# Patient Record
Sex: Male | Born: 1975 | Race: Black or African American | Hispanic: No | Marital: Single | Smoking: Never smoker
Health system: Southern US, Community
[De-identification: ages and names within clinical notes are randomized; demographics above are authoritative.]

## PROBLEM LIST (undated history)

## (undated) DIAGNOSIS — K219 Gastro-esophageal reflux disease without esophagitis: Secondary | ICD-10-CM

## (undated) DIAGNOSIS — J45909 Unspecified asthma, uncomplicated: Secondary | ICD-10-CM

## (undated) DIAGNOSIS — R4189 Other symptoms and signs involving cognitive functions and awareness: Secondary | ICD-10-CM

## (undated) DIAGNOSIS — F410 Panic disorder [episodic paroxysmal anxiety] without agoraphobia: Secondary | ICD-10-CM

## (undated) HISTORY — DX: Gastro-esophageal reflux disease without esophagitis: K21.9

## (undated) HISTORY — DX: Other symptoms and signs involving cognitive functions and awareness: R41.89

---

## 2001-03-18 ENCOUNTER — Emergency Department (HOSPITAL_COMMUNITY): Admission: EM | Admit: 2001-03-18 | Discharge: 2001-03-18 | Payer: Self-pay | Admitting: Emergency Medicine

## 2004-04-13 ENCOUNTER — Ambulatory Visit: Payer: Self-pay | Admitting: Family Medicine

## 2006-05-15 ENCOUNTER — Ambulatory Visit: Payer: Self-pay | Admitting: Family Medicine

## 2006-07-06 DIAGNOSIS — F79 Unspecified intellectual disabilities: Secondary | ICD-10-CM

## 2006-07-06 DIAGNOSIS — J45909 Unspecified asthma, uncomplicated: Secondary | ICD-10-CM

## 2007-03-15 ENCOUNTER — Ambulatory Visit: Payer: Self-pay | Admitting: Family Medicine

## 2007-05-30 ENCOUNTER — Telehealth: Payer: Self-pay | Admitting: Family Medicine

## 2007-06-18 ENCOUNTER — Ambulatory Visit: Payer: Self-pay | Admitting: Sports Medicine

## 2007-06-18 DIAGNOSIS — J309 Allergic rhinitis, unspecified: Secondary | ICD-10-CM

## 2007-12-12 ENCOUNTER — Ambulatory Visit: Payer: Self-pay | Admitting: Family Medicine

## 2007-12-12 ENCOUNTER — Encounter (INDEPENDENT_AMBULATORY_CARE_PROVIDER_SITE_OTHER): Payer: Self-pay | Admitting: Family Medicine

## 2007-12-12 LAB — CONVERTED CEMR LAB
Cholesterol: 157 mg/dL (ref 0–200)
Glucose, Bld: 98 mg/dL (ref 70–99)
HDL: 64 mg/dL (ref >39)

## 2007-12-13 ENCOUNTER — Encounter (INDEPENDENT_AMBULATORY_CARE_PROVIDER_SITE_OTHER): Payer: Self-pay | Admitting: Family Medicine

## 2008-08-12 ENCOUNTER — Ambulatory Visit: Payer: Self-pay | Admitting: Family Medicine

## 2008-08-12 DIAGNOSIS — L259 Unspecified contact dermatitis, unspecified cause: Secondary | ICD-10-CM

## 2009-05-18 ENCOUNTER — Ambulatory Visit: Payer: Self-pay | Admitting: Family Medicine

## 2010-04-07 ENCOUNTER — Ambulatory Visit: Payer: Self-pay | Admitting: Family Medicine

## 2010-06-08 NOTE — Assessment & Plan Note (Signed)
Summary: refill meds,df   Vital Signs:  Patient profile:   35 year old male Height:      63 inches Weight:      120.7 pounds BMI:     21.46 Temp:     97.9 degrees F oral Pulse rate:   62 / minute BP sitting:   129 / 83  (right arm) Cuff size:   regular  Vitals Entered By: Jimmy Footman, CMA (April 07, 2010 11:03 AM) CC: med refill Is Patient Diabetic? No Pain Assessment Patient in pain? no        CC:  med refill.  Asthma History    Initial Asthma Severity Rating:    Age range: 12+ years    Symptoms: 0-2 days/week    Nighttime Awakenings: 0-2/month    Interferes w/ normal activity: no limitations    SABA use (not for EIB): 0-2 days/week    Asthma Severity Assessment: Mild Persistent    Reason for alternate asthma severity rating: Symptoms improved with advair dosing. Now down to use of albuterol 1-2 times per week for increased WOB.    Habits & Providers  Alcohol-Tobacco-Diet     Tobacco Status: never  Physical Exam  General:  alert and well-developed.   Head:  normocephalic and atraumatic.   Eyes:  vision grossly intact, pupils equal, pupils round, and pupils reactive to light.   Ears:  R ear normal and L ear normal.   Nose:  no external deformity.   Mouth:  good dentition.   Neck:  supple and full ROM.   Lungs:  CTAB, no wheezes noted  Heart:  RRR, no rubs, gallops, murmurs  Abdomen:  soft, non-tender, and normal bowel sounds.     Impression & Recommendations:  Problem # 1:  ASTHMA (ICD-493.90) Overall asthma symptomatology much improved with advair per pt. Will continue with advair and as needed albuterol. May consider addition of singulair if sxs flare in spring and summer months. Pt agreeable to plan.  The following medications were removed from the medication list:    Flovent Hfa 110 Mcg/act Aero (Fluticasone propionate  hfa) .Marland Kitchen... 2 puffs two times a day His updated medication list for this problem includes:    Albuterol 90 Mcg/act Aers (Albuterol)  .Marland Kitchen... 2 inh every 4 hours as needed    Advair Diskus 100-50 Mcg/dose Aepb (Fluticasone-salmeterol) .Marland Kitchen... Take 1 puff twice daily  Complete Medication List: 1)  Albuterol 90 Mcg/act Aers (Albuterol) .... 2 inh every 4 hours as needed 2)  Claritin 10 Mg Tabs (Loratadine) .Marland Kitchen.. 1 by mouth daily 3)  Advair Diskus 100-50 Mcg/dose Aepb (Fluticasone-salmeterol) .... Take 1 puff twice daily  Patient Instructions: 1)  It was good to see you today.  2)  Your breathing is doing much better 3)  I will refill your albuterol and advair 4)  We may consider adding something like singulair if your symptoms flare up in the spring/summer 5)  Otherwise, come back to see me in 3-6 months 6)  Call with any questions 7)  God Bless,  8)  Doree Albee MD  Prescriptions: ADVAIR DISKUS 100-50 MCG/DOSE AEPB (FLUTICASONE-SALMETEROL) take 1 puff twice daily  #1 diskus x 11   Entered and Authorized by:   Doree Albee MD   Signed by:   Doree Albee MD on 04/07/2010   Method used:   Electronically to        [facility]  [address] [id number] (retail)       [address]  [place], [place]  [address]       Ph: [phone] or [phone]       Fax: [phone]   RxID:   [id number] ALBUTEROL 90 MCG/ACT AERS (ALBUTEROL) 2 inh every 4 hours as needed  #9 Gram x 3   Entered and Authorized by:   Doree Albee MD   Signed by:   Doree Albee MD on 04/07/2010   Method used:   [facility]ically to        [facility]  [address] [id number] ([facility]       [address]       [place], [place]  [address]       Ph: [phone] or [phone]       Fax: [phone]   RxID:   [id number]       Prevention & Chronic Care Immunizations   Influenza vaccine: Fluvax MCR  (05/18/2009)   Influenza vaccine deferral: Deferred  (04/07/2010)   Influenza vaccine due: 03/14/2008    Tetanus booster: 05/18/2009: Tdap    Pneumococcal vaccine: Not documented  Other Screening   Smoking status:  never  (04/07/2010)  Lipids   Total Cholesterol: 157  (12/12/2007)   LDL: 86  (12/12/2007)   LDL Direct: Not documented   HDL: 64  (12/12/2007)   Triglycerides: 35  (12/12/2007)  Appended Document: refill meds,df    Clinical Lists Changes  Orders: Added new Test order of [facility]- Est Level  3 (28315) - Signed

## 2010-06-08 NOTE — Assessment & Plan Note (Signed)
Summary: f/up asthma,tcb   Vital Signs:  Patient profile:   35 year old male Weight:      116.1 pounds Temp:     97.8 degrees F oral Pulse rate:   54 / minute Pulse rhythm:   regular BP sitting:   124 / 78  (right arm) Cuff size:   regular  Vitals Entered By: Loralee Pacas CMA (May 18, 2009 3:48 PM)   Complete Medication List: 1)  Albuterol 90 Mcg/act Aers (Albuterol) .... 2 inh every 4 hours as needed 2)  Flovent Hfa 110 Mcg/act Aero (Fluticasone propionate  hfa) .... 2 puffs two times a day 3)  Claritin 10 Mg Tabs (Loratadine) .Marland Kitchen.. 1 by mouth daily 4)  Advair Diskus 100-50 Mcg/dose Aepb (Fluticasone-salmeterol) .... Take 1 puff twice daily  Other Orders: Influenza Vaccine MCR (14782) Tdap => 50yrs IM (95621) Admin 1st Vaccine (30865) Prescriptions: ALBUTEROL 90 MCG/ACT AERS (ALBUTEROL) 2 inh every 4 hours as needed  #9 Gram x 3   Entered and Authorized by:   Doree Albee MD   Signed by:   Doree Albee MD on 05/18/2009   Method used:   [facility]        [facility]  [address] [id number]       [address]       [place], [place]  [address]       Ph: [phone] or [phone]       Fax: [phone]   RxID:   [id number] ADVAIR DISKUS 100-50 MCG/DOSE AEPB (FLUTICASONE-SALMETEROL) take 1 puff twice daily  #1 diskus x 3   Entered and Authorized by:   Doree Albee MD   Signed by:   Doree Albee MD on 05/18/2009   Method used:   [facility]        [facility]  [address] [id number]       [address]       [place], [place]  [address]       Ph: [phone] or [phone]       Fax: [phone]   RxID:   [id number]    Immunizations Administered:  Influenza Vaccine # 1:    Vaccine Type: Fluvax MCR    Site: right deltoid    Mfr: GlaxoSmithKline    Dose: 0.5 ml    Route: IM    Given by: Loralee Pacas CMA    Exp. Date: 11/05/2009    Lot #: [id number]    VIS given: 12/07/2008    Physician  counseled: yes  Tetanus Vaccine:    Vaccine Type: Tdap    Site: left deltoid    Mfr: GlaxoSmithKline    Dose: 0.5 ml    Route: IM    Given by: Gladstone Pih    Exp. Date: 07/04/2011    Lot #: [id number]    VIS given: 03/27/07 version given May 18, 2009.    Physician counseled: yes    Prevention & Chronic Care Immunizations   Influenza vaccine: Fluvax MCR  (05/18/2009)   Influenza vaccine due: 03/14/2008    Tetanus booster: 05/18/2009: Tdap    Pneumococcal vaccine: Not documented  Other Screening   Smoking status: never  (06/18/2007)  Lipids   Total Cholesterol: 157  (12/12/2007)   LDL: 86  (12/12/2007)   LDL Direct: Not documented   HDL: 64  (12/12/2007)   Triglycerides: 35  (12/12/2007)   Nursing Instructions: Give tetanus booster today  Appended Document: f/up asthma,tcb-please review addendum **Initial office document prematurely signed...  S- Pt seen for asthma followup. No complaints of SOB, dyspnea, wheezing. Pt states that he uses albuterol 2-3 times/week for episodes of wheezing. Pt states wheezing occurs most often at work where it can be very cold sometimes.  O- AFVSS Gen- awake, NAD in setting of baseline mild MR CV- RRR, no rubs gallops murmurs Pulm- Faint end-expiratory wheezes in bases bilaterally Abd- S/NT/ND A/P- 24 YOM w/ PMhx/o MR and asthma here for asthma followup Asthma- Asthma relatively stable, however w/ persistent albuterol needs. Pulmonary exam stable, however pertinent for obstructive lung sounds. Will start pt on advair as primary maintenance medication, w/ continuation of as needed albuterol. Pt advised to discontinue flovent.

## 2010-06-17 ENCOUNTER — Encounter: Payer: Self-pay | Admitting: *Deleted

## 2011-04-27 ENCOUNTER — Other Ambulatory Visit: Payer: Self-pay | Admitting: Family Medicine

## 2011-04-27 NOTE — Telephone Encounter (Signed)
Refill request

## 2012-01-27 ENCOUNTER — Encounter: Payer: Self-pay | Admitting: Family Medicine

## 2012-01-27 ENCOUNTER — Ambulatory Visit (INDEPENDENT_AMBULATORY_CARE_PROVIDER_SITE_OTHER): Payer: Medicare Other | Admitting: Family Medicine

## 2012-01-27 VITALS — BP 127/84 | HR 87 | Temp 98.2°F | Ht 63.0 in | Wt 123.7 lb

## 2012-01-27 DIAGNOSIS — Z23 Encounter for immunization: Secondary | ICD-10-CM

## 2012-01-27 DIAGNOSIS — J45909 Unspecified asthma, uncomplicated: Secondary | ICD-10-CM

## 2012-01-27 MED ORDER — FLUTICASONE-SALMETEROL 100-50 MCG/DOSE IN AEPB: 1.0000 | INHALATION_SPRAY | Freq: Two times a day (BID) | RESPIRATORY_TRACT | Status: DC

## 2012-01-27 MED ORDER — ALBUTEROL SULFATE HFA 108 (90 BASE) MCG/ACT IN AERS: 2.0000 | INHALATION_SPRAY | Freq: Four times a day (QID) | RESPIRATORY_TRACT | Status: DC | PRN

## 2012-01-27 NOTE — Assessment & Plan Note (Signed)
Patient with mild intermittent asthma, cannot recall last exacerbation.  Here for periodic follow-up and to renew his medications.   Would continue to use the Advair, instructed on Twice-daily use.  Albuterol HFA as needed' to call if he is needing this on routine basis.   Follow up in 6 months or sooner if needed.

## 2012-01-27 NOTE — Progress Notes (Signed)
  Subjective:    Patient ID: Matthew Ellison, male    DOB: January 23, 1976, 36 y.o.   MRN: [id number]  HPI Patient seen today for asthma follow up.  He is out of albuterol rescue HFA and has been out for "over a year", but has not needed it in a long time.  Cannot recall the last time he has used HFA.   He uses Advair 1 puff once daily (prescribed 2 times daily).  Never been a smoker.    Not short of breath, no cough.  Does not use antihistamine, says he doesn't feel like he has allergies.    Review of Systems See above    Objective:   Physical Exam Well appearing, no apparent distress HEENT Neck supple. Boggy nasal mucosa.  TMs clear. Clear oropharynx. No cervical adenopathy COR Regular S1S2, no extra sounds PULM Clear bilaterally, no rales or wheezes        Assessment & Plan:

## 2012-01-27 NOTE — Patient Instructions (Addendum)
It was a pleasure to meet you today.   For the asthma control, please use the ADVAIR (purple disk), 1 puff two times a day.   For shortness of breath from asthma, you may use the ALBUTEROL (Proventil) puffer, 2 puffs every 4 hours as needed.   Follow up in 6 months or sooner as needed.   You got a flu shot today.

## 2013-01-24 ENCOUNTER — Encounter (HOSPITAL_COMMUNITY): Payer: Self-pay | Admitting: Emergency Medicine

## 2013-01-24 ENCOUNTER — Emergency Department (HOSPITAL_COMMUNITY)
Admission: EM | Admit: 2013-01-24 | Discharge: 2013-01-24 | Disposition: A | Discharge status: Home / Self Care | Payer: Medicare Other | Attending: Emergency Medicine | Admitting: Emergency Medicine

## 2013-01-24 DIAGNOSIS — F410 Panic disorder [episodic paroxysmal anxiety] without agoraphobia: Secondary | ICD-10-CM

## 2013-01-24 DIAGNOSIS — R05 Cough: Secondary | ICD-10-CM | POA: Insufficient documentation

## 2013-01-24 DIAGNOSIS — R0602 Shortness of breath: Secondary | ICD-10-CM | POA: Insufficient documentation

## 2013-01-24 DIAGNOSIS — R059 Cough, unspecified: Secondary | ICD-10-CM | POA: Insufficient documentation

## 2013-01-24 DIAGNOSIS — J45901 Unspecified asthma with (acute) exacerbation: Secondary | ICD-10-CM

## 2013-01-24 HISTORY — DX: Unspecified asthma, uncomplicated: J45.909

## 2013-01-24 HISTORY — DX: Panic disorder (episodic paroxysmal anxiety): F41.0

## 2013-01-24 MED ORDER — ALBUTEROL SULFATE HFA 108 (90 BASE) MCG/ACT IN AERS
2.0000 | INHALATION_SPRAY | RESPIRATORY_TRACT | Status: DC | PRN
  Administered 2013-01-24: 2 via RESPIRATORY_TRACT
  Filled 2013-01-24: qty 6.7

## 2013-01-24 NOTE — ED Provider Notes (Signed)
CSN: [id number]     Arrival date & time 01/24/13  1030 History   First MD Initiated Contact with Patient 01/24/13 1112     Chief Complaint  Patient presents with  . Panic Attack   (Consider location/radiation/quality/duration/timing/severity/associated sxs/prior Treatment) HPI Comments: Patient presents with complaint of asthma attack and panic attack. Patient states that he was having difficulty breathing earlier today and this caused him to become scared. He does not currently have an inhaler. No treatments prior to arrival other than albuterol nebulizer given by EMS. This appears to improve symptoms. Currently patient is in no distress. Symptoms resolved. Onset acute. Nothing makes symptoms worse.  The history is provided by the patient.    Past Medical History  Diagnosis Date  . Panic attacks   . Asthma    History reviewed. No pertinent past surgical history. No family history on file. History  Substance Use Topics  . Smoking status: Never Smoker   . Smokeless tobacco: Not on file  . Alcohol Use: No    Review of Systems  Constitutional: Negative for fever.  HENT: Negative for sore throat and rhinorrhea.   Eyes: Negative for redness.  Respiratory: Positive for cough, shortness of breath and wheezing.   Cardiovascular: Negative for chest pain.  Gastrointestinal: Negative for nausea, vomiting, abdominal pain and diarrhea.  Genitourinary: Negative for dysuria.  Musculoskeletal: Negative for myalgias.  Skin: Negative for rash.  Neurological: Negative for headaches.  Psychiatric/Behavioral: Negative for suicidal ideas. The patient is nervous/anxious.     Allergies  Review of patient's allergies indicates no known allergies.  Home Medications   Current Outpatient Rx  Name  Route  Sig  Dispense  Refill  . albuterol (PROVENTIL HFA;VENTOLIN HFA) 108 (90 BASE) MCG/ACT inhaler   Inhalation   Inhale 2 puffs into the lungs every 6 (six) hours as needed for wheezing.   1  Inhaler   4   . Fluticasone-Salmeterol (ADVAIR DISKUS) 100-50 MCG/DOSE AEPB   Inhalation   Inhale 1 puff into the lungs 2 (two) times daily.   60 each   3   . loratadine (CLARITIN) 10 MG tablet   Oral   Take 10 mg by mouth daily.            BP 142/84  Pulse 106  Temp(Src) 98.6 F (37 C) (Oral)  Resp 20  SpO2 100% Physical Exam  Nursing note and vitals reviewed. Constitutional: He appears well-developed and well-nourished.  HENT:  Head: Normocephalic and atraumatic.  Eyes: Conjunctivae are normal. Right eye exhibits no discharge. Left eye exhibits no discharge.  Neck: Normal range of motion. Neck supple.  Cardiovascular: Normal rate, regular rhythm and normal heart sounds.   Pulmonary/Chest: Effort normal and breath sounds normal. No respiratory distress. He has no wheezes. He has no rales.  Abdominal: Soft. There is no tenderness.  Neurological: He is alert.  Skin: Skin is warm and dry.  Psychiatric: He has a normal mood and affect.    ED Course  Procedures (including critical care time) Labs Review Labs Reviewed - No data to display Imaging Review No results found.  12:01 PM Patient seen and examined. Medications ordered.   Vital signs reviewed and are as follows: Filed Vitals:   01/24/13 1033  BP: 142/84  Pulse: 106  Temp: 98.6 F (37 C)  Resp: 20   12:01 PM Patient counseled on use of albuterol HFA.  Told to use 1-2 puffs q 4 hours as needed for SOB.  Patient urged to  return with worsening symptoms or other concerns. Patient verbalized understanding and agrees with plan.    MDM   1. Asthma attack   2. Panic attack    Patient with asthma, resolved. Panic attack likely secondary to asthma attack. Patient is feeling better without additional treatment. Stable for discharge.    Renne Crigler, PA-C 01/24/13 [phone]

## 2013-01-24 NOTE — ED Provider Notes (Signed)
Medical screening examination/treatment/procedure(s) were performed by non-physician practitioner and as supervising physician I was immediately available for consultation/collaboration.   Enid Skeens, MD 01/24/13 2156

## 2013-01-24 NOTE — ED Notes (Signed)
Per EMS, pt has history of panic attacks-[facility] thought it was an asthma attack so they gave him a neb-EMS arrived & pt requested eval at ED

## 2013-02-01 ENCOUNTER — Other Ambulatory Visit: Payer: Self-pay | Admitting: Family Medicine

## 2013-02-06 ENCOUNTER — Encounter (HOSPITAL_COMMUNITY): Payer: Self-pay | Admitting: Vascular Surgery

## 2013-02-06 ENCOUNTER — Inpatient Hospital Stay (HOSPITAL_COMMUNITY)
Admission: EM | Admit: 2013-02-06 | Discharge: 2013-02-08 | DRG: 203 | Disposition: A | Discharge status: Home / Self Care | Payer: Medicare Other | Attending: Family Medicine | Admitting: Family Medicine

## 2013-02-06 ENCOUNTER — Emergency Department (HOSPITAL_COMMUNITY)
Admission: EM | Admit: 2013-02-06 | Discharge: 2013-02-06 | Disposition: A | Discharge status: Home / Self Care | Payer: Medicare Other | Source: Home / Self Care | Attending: Emergency Medicine | Admitting: Emergency Medicine

## 2013-02-06 ENCOUNTER — Telehealth: Payer: Self-pay | Admitting: Family Medicine

## 2013-02-06 ENCOUNTER — Encounter (HOSPITAL_COMMUNITY): Payer: Self-pay | Admitting: Emergency Medicine

## 2013-02-06 ENCOUNTER — Emergency Department (HOSPITAL_COMMUNITY): Payer: Medicare Other

## 2013-02-06 ENCOUNTER — Ambulatory Visit: Payer: Medicare Other | Admitting: Family Medicine

## 2013-02-06 DIAGNOSIS — I498 Other specified cardiac arrhythmias: Secondary | ICD-10-CM | POA: Diagnosis present

## 2013-02-06 DIAGNOSIS — J45901 Unspecified asthma with (acute) exacerbation: Secondary | ICD-10-CM

## 2013-02-06 DIAGNOSIS — F79 Unspecified intellectual disabilities: Secondary | ICD-10-CM

## 2013-02-06 DIAGNOSIS — Z79899 Other long term (current) drug therapy: Secondary | ICD-10-CM

## 2013-02-06 DIAGNOSIS — IMO0002 Reserved for concepts with insufficient information to code with codable children: Secondary | ICD-10-CM

## 2013-02-06 DIAGNOSIS — J45902 Unspecified asthma with status asthmaticus: Principal | ICD-10-CM | POA: Diagnosis present

## 2013-02-06 MED ORDER — ALBUTEROL (5 MG/ML) CONTINUOUS INHALATION SOLN
15.0000 mg/h | INHALATION_SOLUTION | RESPIRATORY_TRACT | Status: DC
  Administered 2013-02-06: 15 mg/h via RESPIRATORY_TRACT

## 2013-02-06 MED ORDER — ALBUTEROL SULFATE HFA 108 (90 BASE) MCG/ACT IN AERS
2.0000 | INHALATION_SPRAY | RESPIRATORY_TRACT | Status: DC | PRN
  Filled 2013-02-06: qty 6.7

## 2013-02-06 MED ORDER — PREDNISONE 20 MG PO TABS: 40.0000 mg | ORAL_TABLET | Freq: Every day | ORAL | Status: DC

## 2013-02-06 MED ORDER — FLUTICASONE-SALMETEROL 100-50 MCG/DOSE IN AEPB: 1.0000 | INHALATION_SPRAY | Freq: Two times a day (BID) | RESPIRATORY_TRACT | Status: DC

## 2013-02-06 MED ORDER — ALBUTEROL (5 MG/ML) CONTINUOUS INHALATION SOLN
INHALATION_SOLUTION | RESPIRATORY_TRACT | Status: AC
  Administered 2013-02-06: 15 mg/h via RESPIRATORY_TRACT
  Filled 2013-02-06: qty 20

## 2013-02-06 NOTE — ED Notes (Signed)
Per EMS: pt c/o SOB since 0200, used up inhalant throughout night. 10 mg albuterol,0.5 atroven, t 125 solumedrol. wheezing in upper right lobe, diminished everywhere else. 20 g in left ac.

## 2013-02-06 NOTE — Telephone Encounter (Signed)
Pt was seen in the ED today and apparently given refill of Advair. I have never met him. I will send in a refill of his meds, but he should also follow up in the office. Thanks! --CMS

## 2013-02-06 NOTE — ED Notes (Signed)
Bed: RESB Expected date:  Expected time:  Means of arrival:  Comments: SOB

## 2013-02-06 NOTE — Telephone Encounter (Signed)
Patient needs refill on Advair

## 2013-02-06 NOTE — ED Provider Notes (Signed)
CSN: [id number]     Arrival date & time 02/06/13  0915 History   First MD Initiated Contact with Patient 02/06/13 [phone]     Chief Complaint  Patient presents with  . Shortness of Breath   (Consider location/radiation/quality/duration/timing/severity/associated sxs/prior Treatment) Patient is a 37 y.o. male presenting with shortness of breath. The history is provided by the patient.  Shortness of Breath Associated symptoms: wheezing   Associated symptoms: no abdominal pain, no chest pain, no fever, no headaches, no rash and no vomiting    Patient shortness of breath. History of asthma. States to get worse around 3 in the morning. He's had albuterol and steroids by EMS. She's had a little bit of cough. No vomiting or diarrhea. He states he ran out of his inhaler last night. Mild dull chest pain. Past Medical History  Diagnosis Date  . Panic attacks   . Asthma    History reviewed. No pertinent past surgical history. No family history on file. History  Substance Use Topics  . Smoking status: Never Smoker   . Smokeless tobacco: Not on file  . Alcohol Use: No    Review of Systems  Constitutional: Negative for fever, activity change and appetite change.  HENT: Negative for neck stiffness.   Eyes: Negative for pain.  Respiratory: Positive for shortness of breath and wheezing. Negative for chest tightness.   Cardiovascular: Negative for chest pain and leg swelling.  Gastrointestinal: Negative for nausea, vomiting, abdominal pain and diarrhea.  Genitourinary: Negative for flank pain.  Musculoskeletal: Negative for back pain.  Skin: Negative for rash.  Neurological: Negative for weakness, numbness and headaches.  Psychiatric/Behavioral: Negative for behavioral problems.    Allergies  Review of patient's allergies indicates no known allergies.  Home Medications   Current Outpatient Rx  Name  Route  Sig  Dispense  Refill  . albuterol (PROVENTIL HFA;VENTOLIN HFA) 108 (90 BASE)  MCG/ACT inhaler   Inhalation   Inhale 2 puffs into the lungs every 6 (six) hours as needed for wheezing or shortness of breath.         . Fluticasone-Salmeterol (ADVAIR) 100-50 MCG/DOSE AEPB   Inhalation   Inhale 1 puff into the lungs every 12 (twelve) hours.   60 each   0   . predniSONE (DELTASONE) 20 MG tablet   Oral   Take 2 tablets (40 mg total) by mouth daily.   4 tablet   0    BP 146/87  Pulse 106  Temp(Src) 98.7 F (37.1 C) (Oral)  Resp 20  SpO2 97% Physical Exam  Nursing note and vitals reviewed. Constitutional: He is oriented to person, place, and time. He appears well-developed and well-nourished.  HENT:  Head: Normocephalic and atraumatic.  Eyes: EOM are normal. Pupils are equal, round, and reactive to light.  Neck: Normal range of motion. Neck supple.  Cardiovascular: Regular rhythm and normal heart sounds.   No murmur heard. Pulmonary/Chest:  Diffuse wheezes and prolonged expirations. Wheezes or more prominent on right side. No retractions. No respiratory distress.  Abdominal: Soft. Bowel sounds are normal. He exhibits no distension and no mass. There is no tenderness. There is no rebound and no guarding.  Musculoskeletal: Normal range of motion. He exhibits no edema.  Neurological: He is alert and oriented to person, place, and time. No cranial nerve deficit.  Skin: Skin is warm and dry.  Psychiatric: He has a normal mood and affect.    ED Course  Procedures (including critical care time) Labs Review Labs  Reviewed - No data to display Imaging Review Dg Chest 2 View  02/06/2013   CLINICAL DATA:  Shortness of breath. Asthma.  EXAM: CHEST  2 VIEW  COMPARISON:  None.  FINDINGS: The heart size and mediastinal contours are within normal limits. Both lungs are clear. The visualized skeletal structures are unremarkable.  IMPRESSION: No active cardiopulmonary disease.   Electronically Signed   By: Geanie Cooley   On: 02/06/2013 10:21    MDM   1. Asthma  exacerbation    Patient with shortness of breath. Likely asthma exacerbation. X-ray reassuring. Patient feels much better. He is not hypoxic. Will be discharged home. His Advair was refilled and he'll be given 2 days of steroids. He was given albuterol inhaler.    Juliet Rude. Rubin Payor, MD 02/06/13 1104

## 2013-02-06 NOTE — ED Provider Notes (Signed)
CSN: [id number]     Arrival date & time 02/06/13  2326 History   First MD Initiated Contact with Patient 02/06/13 2337     Chief Complaint  Patient presents with  . Shortness of Breath   (Consider location/radiation/quality/duration/timing/severity/associated sxs/prior Treatment) HPI Level V caveat: On CPAP This is a 37 year old male with a history of asthma. He is here with an asthma attack that began about 8 PM. He did not get adequate relief with his home albuterol and called EMS just prior to arrival. On their arrival he was very diminished on the right and had no breath sounds on the left, obviously in distress. They administered prior to arrival a total of 15 mg of albuterol, 1 mg of Atrovent by nebs and 125 mg of Solu-Medrol IV. He was placed on CPAP with improved air movement and subjective improvement per the patient. He is having chest pain with breathing.  The patient was seen at the [facility] ED earlier today for a less severe attack.  Past Medical History  Diagnosis Date  . Panic attacks   . Asthma    History reviewed. No pertinent past surgical history. History reviewed. No pertinent family history. History  Substance Use Topics  . Smoking status: Never Smoker   . Smokeless tobacco: Not on file  . Alcohol Use: No    Review of Systems  Allergies  Review of patient's allergies indicates no known allergies.  Home Medications   Current Outpatient Rx  Name  Route  Sig  Dispense  Refill  . albuterol (PROVENTIL HFA;VENTOLIN HFA) 108 (90 BASE) MCG/ACT inhaler   Inhalation   Inhale 2 puffs into the lungs every 6 (six) hours as needed for wheezing or shortness of breath.         . Fluticasone-Salmeterol (ADVAIR) 100-50 MCG/DOSE AEPB   Inhalation   Inhale 1 puff into the lungs every 12 (twelve) hours.   60 each   1   . predniSONE (DELTASONE) 20 MG tablet   Oral   Take 2 tablets (40 mg total) by mouth daily.   4 tablet   0    BP 153/88  Pulse 126  Resp 32   SpO2 100%  Physical Exam General: Well-developed, well-nourished male in no mild distress; appearance consistent with age of record HENT: normocephalic; atraumatic Eyes: pupils equal, round and reactive to light; extraocular muscles intact Neck: supple Heart: regular rate and rhythm; tachycardic Lungs: On CPAP; inspiratory and expiratory wheezing with decreased air movement bilaterally Abdomen: soft; nondistended; nontender; no masses or hepatosplenomegaly; bowel sounds present Extremities: No deformity; full range of motion; pulses normal Neurologic: Awake, alert; motor function intact in all extremities and symmetric; no facial droop Skin: Warm and dry    ED Course  Procedures (including critical care time)  CRITICAL CARE Performed by: Yussef Jorge L Total critical care time: 30 minutes Critical care time was exclusive of separately billable procedures and treating other patients. Critical care was necessary to treat or prevent imminent or life-threatening deterioration. Critical care was time spent personally by me on the following activities: development of treatment plan with patient and/or surrogate as well as nursing, discussions with consultants, evaluation of patient's response to treatment, examination of patient, obtaining history from patient or surrogate, ordering and performing treatments and interventions, ordering and review of laboratory studies, ordering and review of radiographic studies, pulse oximetry and re-evaluation of patient's condition.   MDM   Nursing notes and vitals signs, including pulse oximetry, reviewed.  Summary of  this visit's results, reviewed by myself:  Labs:  Results for orders placed during the hospital encounter of 02/06/13 (from the past 24 hour(s))  CBC WITH DIFFERENTIAL     Status: Abnormal   Collection Time    02/06/13 11:58 PM      Result Value Range   WBC 6.0  4.0 - 10.5 K/uL   RBC 5.29  4.22 - 5.81 MIL/uL   Hemoglobin 15.8   13.0 - 17.0 g/dL   HCT 16.1  09.6 - 04.5 %   MCV 90.7  78.0 - 100.0 fL   MCH 29.9  26.0 - 34.0 pg   MCHC 32.9  30.0 - 36.0 g/dL   RDW 40.9  81.1 - 91.4 %   Platelets 140 (*) 150 - 400 K/uL   Neutrophils Relative % 89 (*) 43 - 77 %   Neutro Abs 5.3  1.7 - 7.7 K/uL   Lymphocytes Relative 8 (*) 12 - 46 %   Lymphs Abs 0.5 (*) 0.7 - 4.0 K/uL   Monocytes Relative 3  3 - 12 %   Monocytes Absolute 0.2  0.1 - 1.0 K/uL   Eosinophils Relative 0  0 - 5 %   Eosinophils Absolute 0.0  0.0 - 0.7 K/uL   Basophils Relative 0  0 - 1 %   Basophils Absolute 0.0  0.0 - 0.1 K/uL  POCT I-STAT 3, BLOOD GAS (G3P V)     Status: Abnormal   Collection Time    02/07/13 12:26 AM      Result Value Range   pH, Ven 7.264  7.250 - 7.300   pCO2, Ven 53.6 (*) 45.0 - 50.0 mmHg   pO2, Ven 43.0  30.0 - 45.0 mmHg   Bicarbonate 24.2 (*) 20.0 - 24.0 mEq/L   TCO2 26  0 - 100 mmol/L   O2 Saturation 70.0     Acid-base deficit 4.0 (*) 0.0 - 2.0 mmol/L   Collection site BRACHIAL ARTERY     Sample type VENOUS    POCT I-STAT, CHEM 8     Status: Abnormal   Collection Time    02/07/13 12:27 AM      Result Value Range   Sodium 142  135 - 145 mEq/L   Potassium 3.9  3.5 - 5.1 mEq/L   Chloride 106  96 - 112 mEq/L   BUN 16  6 - 23 mg/dL   Creatinine, Ser 7.82  0.50 - 1.35 mg/dL   Glucose, Bld 956 (*) 70 - 99 mg/dL   Calcium, Ion 2.13  0.86 - 1.23 mmol/L   TCO2 23  0 - 100 mmol/L   Hemoglobin 17.7 (*) 13.0 - 17.0 g/dL   HCT 57.8  46.9 - 62.9 %    Imaging Studies: Dg Chest 2 View  02/06/2013   CLINICAL DATA:  Shortness of breath. Asthma.  EXAM: CHEST  2 VIEW  COMPARISON:  None.  FINDINGS: The heart size and mediastinal contours are within normal limits. Both lungs are clear. The visualized skeletal structures are unremarkable.  IMPRESSION: No active cardiopulmonary disease.   Electronically Signed   By: Geanie Cooley   On: 02/06/2013 10:21   Dg Chest Port 1 View  02/07/2013   CLINICAL DATA:  Shortness of Breath.  EXAM:  PORTABLE CHEST - 1 VIEW  COMPARISON:  02/06/2013 at [facility].  FINDINGS: The heart size and mediastinal contours are within normal limits. Both lungs are clear. The visualized skeletal structures are unremarkable.  IMPRESSION: No active  disease.  No change since yesterday.   Electronically Signed   By: Charlett Nose M.D.   On: 02/07/2013 00:33   EKG Interpretation:  Date & Time: 02/07/2013 1:23 AM  Rate: 111  Rhythm: sinus tachycardia  QRS Axis: normal  Intervals: normal  ST/T Wave abnormalities: normal  Conduction Disutrbances:none  Narrative Interpretation: RAH, RVH  Old EKG Reviewed: none available      11:57 PM Continuous neb treatment started.  1:23 AM Lungs clear. Will trial off BiPAP. Family Practice resident to see and admit in ED.  1:30 AM Speaking in full sentences. O2 saturation 100% on 2 L.   Hanley Seamen, MD 02/07/13 [id number]

## 2013-02-06 NOTE — ED Notes (Signed)
Pt arrives to the ED via [facility] on CPAP. Pt began having severe SOB and used his ventolin inhaler and advair with no relief. Upon EMS arrival pt breathing 36 bpm. Absent lung sounds on the left and expiratory wheezing on the right. Pt was given 5 mg of albuterol in the field and lung sounds went to wheezing throughout on both inspiratory and expiratory. O2 was still 88%. Pt was placed on CPAP with albuterol and atrovent. Pt extremely anxoius and breathing at 35 bpm upon arrival and tachycardic in the 130s. Pt also received 125 mg Solumedrol. BP maintained throughout. Pt A&O x4. Pt has inspiratory and expiratory wheezes at this time.

## 2013-02-06 NOTE — Telephone Encounter (Signed)
Forward to PCP for refill.Busick, Robert Lee  

## 2013-02-07 ENCOUNTER — Emergency Department (HOSPITAL_COMMUNITY): Payer: Medicare Other

## 2013-02-07 DIAGNOSIS — J45902 Unspecified asthma with status asthmaticus: Secondary | ICD-10-CM | POA: Diagnosis present

## 2013-02-07 DIAGNOSIS — F79 Unspecified intellectual disabilities: Secondary | ICD-10-CM

## 2013-02-07 LAB — CBC
HCT: 42.6 % (ref 39.0–52.0)
Hemoglobin: 14.9 g/dL (ref 13.0–17.0)
MCHC: 35 g/dL (ref 30.0–36.0)
Platelets: 139 10*3/uL — ABNORMAL LOW (ref 150–400)
RBC: 4.76 MIL/uL (ref 4.22–5.81)
RDW: 13.9 % (ref 11.5–15.5)

## 2013-02-07 LAB — CBC WITH DIFFERENTIAL/PLATELET
Basophils Absolute: 0 10*3/uL (ref 0.0–0.1)
Hemoglobin: 15.8 g/dL (ref 13.0–17.0)
Lymphocytes Relative: 8 % — ABNORMAL LOW (ref 12–46)
Lymphs Abs: 0.5 10*3/uL — ABNORMAL LOW (ref 0.7–4.0)
MCV: 90.7 fL (ref 78.0–100.0)
Neutro Abs: 5.3 10*3/uL (ref 1.7–7.7)
Neutrophils Relative %: 89 % — ABNORMAL HIGH (ref 43–77)
Platelets: 140 10*3/uL — ABNORMAL LOW (ref 150–400)
RBC: 5.29 MIL/uL (ref 4.22–5.81)
WBC: 6 10*3/uL (ref 4.0–10.5)

## 2013-02-07 LAB — POCT I-STAT, CHEM 8
BUN: 16 mg/dL (ref 6–23)
Calcium, Ion: 1.16 mmol/L (ref 1.12–1.23)
Chloride: 106 mEq/L (ref 96–112)
Glucose, Bld: 155 mg/dL — ABNORMAL HIGH (ref 70–99)
HCT: 52 % (ref 39.0–52.0)
Hemoglobin: 17.7 g/dL — ABNORMAL HIGH (ref 13.0–17.0)
Potassium: 3.9 mEq/L (ref 3.5–5.1)
Sodium: 142 mEq/L (ref 135–145)

## 2013-02-07 LAB — POCT I-STAT 3, VENOUS BLOOD GAS (G3P V)
Acid-base deficit: 4 mmol/L — ABNORMAL HIGH (ref 0.0–2.0)
Bicarbonate: 24.2 mEq/L — ABNORMAL HIGH (ref 20.0–24.0)
TCO2: 26 mmol/L (ref 0–100)
pCO2, Ven: 53.6 mmHg — ABNORMAL HIGH (ref 45.0–50.0)
pH, Ven: 7.264 (ref 7.250–7.300)
pO2, Ven: 43 mmHg (ref 30.0–45.0)

## 2013-02-07 LAB — BASIC METABOLIC PANEL
CO2: 22 mEq/L (ref 19–32)
GFR calc non Af Amer: >90 mL/min (ref >90)
Glucose, Bld: 177 mg/dL — ABNORMAL HIGH (ref 70–99)
Potassium: 4 mEq/L (ref 3.5–5.1)
Sodium: 139 mEq/L (ref 135–145)

## 2013-02-07 LAB — MRSA PCR SCREENING: MRSA by PCR: NEGATIVE

## 2013-02-07 LAB — CREATININE, SERUM: GFR calc non Af Amer: >90 mL/min (ref >90)

## 2013-02-07 MED ORDER — ALBUTEROL SULFATE (5 MG/ML) 0.5% IN NEBU
2.5000 mg | INHALATION_SOLUTION | RESPIRATORY_TRACT | Status: DC
  Administered 2013-02-07 (×2): 2.5 mg via RESPIRATORY_TRACT
  Filled 2013-02-07: qty 0.5

## 2013-02-07 MED ORDER — ALBUTEROL (5 MG/ML) CONTINUOUS INHALATION SOLN
15.0000 mg/h | INHALATION_SOLUTION | RESPIRATORY_TRACT | Status: DC
  Administered 2013-02-07 (×2): 15 mg/h via RESPIRATORY_TRACT

## 2013-02-07 MED ORDER — ALBUTEROL SULFATE (5 MG/ML) 0.5% IN NEBU
2.5000 mg | INHALATION_SOLUTION | RESPIRATORY_TRACT | Status: DC
  Administered 2013-02-07 (×2): 2.5 mg via RESPIRATORY_TRACT
  Filled 2013-02-07 (×2): qty 0.5

## 2013-02-07 MED ORDER — METHYLPREDNISOLONE SODIUM SUCC 40 MG IJ SOLR
40.0000 mg | Freq: Two times a day (BID) | INTRAMUSCULAR | Status: DC
  Filled 2013-02-07 (×2): qty 1

## 2013-02-07 MED ORDER — METHYLPREDNISOLONE SODIUM SUCC 125 MG IJ SOLR
125.0000 mg | Freq: Once | INTRAMUSCULAR | Status: AC
  Administered 2013-02-07: 125 mg via INTRAVENOUS
  Filled 2013-02-07: qty 2

## 2013-02-07 MED ORDER — ALBUTEROL (5 MG/ML) CONTINUOUS INHALATION SOLN: 15.0000 mg/h | INHALATION_SOLUTION | RESPIRATORY_TRACT | Status: DC

## 2013-02-07 MED ORDER — ENOXAPARIN SODIUM 40 MG/0.4ML ~~LOC~~ SOLN
40.0000 mg | SUBCUTANEOUS | Status: DC
  Administered 2013-02-07: 40 mg via SUBCUTANEOUS
  Filled 2013-02-07 (×2): qty 0.4

## 2013-02-07 MED ORDER — SODIUM CHLORIDE 0.9 % IV SOLN
INTRAVENOUS | Status: AC
  Administered 2013-02-07: 09:00:00 via INTRAVENOUS

## 2013-02-07 MED ORDER — IPRATROPIUM BROMIDE 0.02 % IN SOLN
0.5000 mg | RESPIRATORY_TRACT | Status: DC | PRN
Start: 2013-02-07 — End: 2013-02-08

## 2013-02-07 MED ORDER — ALBUTEROL SULFATE (5 MG/ML) 0.5% IN NEBU
2.5000 mg | INHALATION_SOLUTION | RESPIRATORY_TRACT | Status: DC | PRN
  Filled 2013-02-07: qty 0.5

## 2013-02-07 MED ORDER — IPRATROPIUM BROMIDE 0.02 % IN SOLN
0.5000 mg | RESPIRATORY_TRACT | Status: DC
  Administered 2013-02-07 (×2): 0.5 mg via RESPIRATORY_TRACT
  Filled 2013-02-07 (×2): qty 2.5

## 2013-02-07 MED ORDER — MOMETASONE FURO-FORMOTEROL FUM 100-5 MCG/ACT IN AERO
2.0000 | INHALATION_SPRAY | Freq: Two times a day (BID) | RESPIRATORY_TRACT | Status: DC
  Administered 2013-02-08: 2 via RESPIRATORY_TRACT
  Filled 2013-02-07 (×2): qty 8.8

## 2013-02-07 MED ORDER — SODIUM CHLORIDE 0.9 % IJ SOLN
3.0000 mL | Freq: Two times a day (BID) | INTRAMUSCULAR | Status: DC
  Administered 2013-02-07: 3 mL via INTRAVENOUS

## 2013-02-07 MED ORDER — ALBUTEROL SULFATE (5 MG/ML) 0.5% IN NEBU
2.5000 mg | INHALATION_SOLUTION | Freq: Four times a day (QID) | RESPIRATORY_TRACT | Status: DC
  Administered 2013-02-08 (×3): 2.5 mg via RESPIRATORY_TRACT
  Filled 2013-02-07 (×3): qty 0.5

## 2013-02-07 MED ORDER — IPRATROPIUM BROMIDE 0.02 % IN SOLN
0.5000 mg | Freq: Four times a day (QID) | RESPIRATORY_TRACT | Status: DC
  Administered 2013-02-08 (×3): 0.5 mg via RESPIRATORY_TRACT
  Filled 2013-02-07 (×3): qty 2.5

## 2013-02-07 MED ORDER — PREDNISONE 50 MG PO TABS
50.0000 mg | ORAL_TABLET | Freq: Every day | ORAL | Status: DC
  Administered 2013-02-08: 50 mg via ORAL
  Filled 2013-02-07 (×2): qty 1

## 2013-02-07 NOTE — Progress Notes (Signed)
Family Medicine Progress Note  Pt still w/ significant wheezing and poor air movement w/ incr. WOB. O2 sats preserved. Pt w/o albuterol for sometime overnight. Now on CAT. No need for intubation at this time. No h/o intubation.  - Adding ipratropium continuous - Solumedrol 125mg  - recheck in 1 hr  Shelly Flatten, MD Family Medicine PGY-3 02/07/2013, 8:37 AM

## 2013-02-07 NOTE — H&P (Signed)
FMTS Attending Admission Note: Denny Levy MD [phone] pager office [phone] I  have seen and examined this patient, reviewed their chart. I have discussed this patient with the resident. I agree with the resident's findings, assessment and care plan. He is still quite tight on lung exam, with little air movement, despite nebs,. We are going to do another CAT 1 hour. I will also give him some lorazepam or morphine as he is a little uncomfortable from air hunger and anxiety /tachycardia. We will watch him closely---he is still in ED as there are no step down beds currently. I have discussed with day attending Dr Lum Babe and with team.

## 2013-02-07 NOTE — Progress Notes (Signed)
Patient's legal guardian was requesting if patient  can have a home nebulizer machine.

## 2013-02-07 NOTE — Progress Notes (Signed)
Patient taken off of Bipap per Dr. Read Drivers and placed on 2L nasal cannula at this time. Tolerating well, no distress noted, RT will continue to monitor.

## 2013-02-07 NOTE — H&P (Signed)
Family Medicine Teaching [facility] Admission History and Physical Service Pager: [phone]  Patient name: Matthew Ellison Medical record number: [id number] Date of birth: Sep 06, 1975 Age: 37 y.o. Gender: male  Primary Care Provider: Maryjean Ka, MD Consultants: None Code Status: Full  Chief Complaint: Status asthmaticus  Assessment and Plan: Matthew Ellison is a 37 y.o. male presenting with shortness of breath in status asthmaticus. PMH is significant for asthma and mental retardation.  # Status asthmaticus - Patient with recent "chest cold" last week precipitating asthma exacerbation with no fever, leukocytosis, or CXR abnormality. Would not call this medication failure, as before last exacerbation 1 month ago, pt had been stable since 2003. - Admit to inpatient Stepdown unit on telemetry, [facility] Service, attending Dr. Jennette Kettle - S/p solu-medrol in EMS and CAT and BiPAP in ED, with improvement in aeration and addition of O2 by [place] with improvement in pt comfort and oxygenation - Continuing 3L O2 by [place] - Spacing albuterol to nebs q1 hour  - Dulera  (equivalent dose of pt's advair) - 5 days steroids: solu-medrol 40mg  IV q12 hours (reasses and transition to PO prednisone when pt significantly improved, possibly later today) - RT consulted - Acidosis by ABG with high CO2 - secondary to asthma [ ]  Consider antibiotics [ ]  Needs asthma action plan, flu shot, and f/u with PCP prior to d/c [ ]  Recheck AM ABG  # Mental retardation - Mother not present, though patient seems to have good grasp of his asthma. - Update mother in AM if she is present   FEN/GI: NPO pending improvement in respiratory status; NS at 125cc/hr x 12 hours  Prophylaxis: Lovenox  Disposition: Admitting to stepdown unit when bed available. Discharge pending improvement in respiratory status.  History of Present Illness: Matthew Ellison is a 37 y.o. male presenting with shortness of  breath in status asthmaticus. PMH is significant for asthma and mental retardation.  Patient states he started having a "chest cold" with congestion, coughing up yellow mucus, and feeling sick 5 days ago. He denies fevers, chills, abdominal pain, nasuea, vomiting, dairrhea, or syncope. His aunt was / is sick. Then yesterday he had to go to [facility] ED due to shortness of breath and was sent home with prednisone. Today, he still felt very short of breath and called EMS because he could barely stand up due to the dyspnea.   He currently still feels a little more short of breath than baseline.   For his asthma control, he takes advair ONCE daily and albuterol he needs once weekly. He has not had his flu shot yet this year but would like to. He has never had to be admitted for an exacerbation though he has come to the ED twice now in the last month. Previously, he had not needed help since 2003.      Review Of Systems: Per HPI. Otherwise 12 point review of systems was performed and was unremarkable.  Patient Active Problem List   Diagnosis Date Noted  . ECZEMA 08/12/2008  . RHINITIS 06/18/2007  . 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 Unspecified asthma 07/06/2006    Past Medical History: Past Medical History  Diagnosis Date  . Panic attacks   . Asthma    Past Surgical History: History reviewed. No pertinent past surgical history. Social History: History  Substance Use Topics  . Smoking status: Never Smoker   . Smokeless tobacco: Not on file  . Alcohol Use: No  Lives with mom who helps pt No  tobacco, etoh, drugs  Additional social history:  Please also refer to relevant sections of EMR.  Family History: History reviewed. No pertinent family history. Brother and sister and father have asthma  Allergies and Medications: No Known Allergies No current facility-administered medications on file prior to encounter.   Current Outpatient Prescriptions on File Prior to Encounter  Medication  Sig Dispense Refill  . albuterol (PROVENTIL HFA;VENTOLIN HFA) 108 (90 BASE) MCG/ACT inhaler Inhale 2 puffs into the lungs every 6 (six) hours as needed for wheezing or shortness of breath.      . predniSONE (DELTASONE) 20 MG tablet Take 2 tablets (40 mg total) by mouth daily.  4 tablet  0    Objective: BP 137/91  Pulse 118  Resp 28  SpO2 100% Exam: General: NAD, pleasant HEENT: AT/[place], sclera clear, o/p clear, PERRL, EOMI Cardiovascular: Tachycardia, no murmurs, rubs, or gallops Respiratory: Normal effort though decreased air entry left > right, mild inspiratory and expiratory wheezing, speaking in sentences with occasional pauses Abdomen: Soft, nontender, nondistended, NABS Extremities: No LE edema or tenderness Skin: No rash or cyanosis Neuro: Awake, alert, no focal deficits, normal / mildly pressured speech though h/o MR    Labs and Imaging: CBC BMET   Recent Labs Lab 02/06/13 2358 02/07/13 0027  WBC 6.0  --   HGB 15.8 17.7*  HCT 48.0 52.0  PLT 140*  --     Recent Labs Lab 02/07/13 0027  NA 142  K 3.9  CL 106  BUN 16  CREATININE 1.00  GLUCOSE 155*      ABG venous ph 7.26, pco2 53.6  DG 2-view 10/1:IMPRESSION:  No active cardiopulmonary disease.  DG Chest x-ray portable 1-view 10/2: IMPRESSION:  No active disease. No change since yesterday.   VBG with pH 7.26 and pCO2 53.6  EKG sinus tach   Leona Singleton, MD 02/07/2013, 1:25 AM PGY-2, [facility] Family Medicine FPTS Intern pager: [phone], text pages welcome

## 2013-02-08 MED ORDER — ALBUTEROL SULFATE (2.5 MG/3ML) 0.083% IN NEBU: 2.5000 mg | INHALATION_SOLUTION | RESPIRATORY_TRACT | Status: DC | PRN

## 2013-02-08 MED ORDER — PREDNISONE 50 MG PO TABS: 50.0000 mg | ORAL_TABLET | Freq: Every day | ORAL | Status: DC

## 2013-02-08 MED ORDER — INFLUENZA VAC SPLIT QUAD 0.5 ML IM SUSP
0.5000 mL | Freq: Once | INTRAMUSCULAR | Status: AC
  Administered 2013-02-08: 0.5 mL via INTRAMUSCULAR
  Filled 2013-02-08: qty 0.5

## 2013-02-08 NOTE — Progress Notes (Signed)
PCP Note  Pt seen at bedside. I have not met Mr. Mckendry before today. I introduced myself as his new assigned PCP. He states he is feeling much better and is hopeful to go home today. I appreciate the excellent care provided by the FPTS providers as well as the nursing and RT staff. Please do not hesitate to call if I can be of any assistance.  Bobbye Morton, MD [facility] Family Medicine Pager: [phone] 02/08/2013, 8:42 AM

## 2013-02-08 NOTE — Progress Notes (Signed)
FMTS Attending Admission Note: Matthew Lewis,MD I  have seen and examined this patient, reviewed their chart. I have discussed this patient with the resident. I agree with the resident's findings, assessment and care plan. Patient sounds great,may d/c home if weaned off O2.

## 2013-02-08 NOTE — Progress Notes (Signed)
Family Medicine Teaching Service Daily Progress Note Intern Pager: [phone]  Patient name: Matthew Ellison Medical record number: [id number] Date of birth: [birth year] Age: 37 y.o. Gender: male  Primary Care Provider: Maryjean Ka, MD Consultants: none Code Status: full  Pt Overview and Major Events to Date:  10/2 - admitted early am and received CAT in ED  Assessment and Plan: Matthew Ellison is a 37 y.o. male presenting with shortness of breath in status asthmaticus. PMH is significant for asthma and mental retardation.   # Status asthmaticus - Patient with recent "chest cold" last week precipitating asthma exacerbation with no fever, leukocytosis, or CXR abnormality.  - S/p solu-medrol in EMS and CAT and BiPAP in ED, with improvement in aeration and addition of O2 by [place] with improvement in pt comfort and oxygenation  - Continuing 2L O2 by [place], wean as tolerated  - Spacing albuterol to nebs q6 hour  - Dulera (equivalent dose of pt's advair)  - 5 days steroids: PO prednisone now as pt significantly improved - RT consulted  - Acidosis by ABG with high CO2 - secondary to asthma  [ ]  Needs asthma action plan, flu shot, and f/u with PCP prior to d/c   # Mental retardation - Mother not present, though patient seems to have good grasp of his asthma.  - Update mother in AM if she is present   FEN/GI: regular diet, saline lock IV  Prophylaxis: Lovenox  Disposition: home later today if able to wean O2  Subjective: Feeling well this morning, breathing comfortably. No pain.  Objective: Temp:  [97.5 F (36.4 C)-98.6 F (37 C)] 97.5 F (36.4 C) (10/03 0400) Pulse Rate:  [70-133] 70 (10/03 0400) Resp:  [18-27] 18 (10/03 0400) BP: (119-146)/(56-71) 119/60 mmHg (10/03 0400) SpO2:  [97 %-100 %] 100 % (10/03 0400) Weight:  [114 lb 10.2 oz (52 kg)] 114 lb 10.2 oz (52 kg) (10/02 1347) Physical Exam: General: NAD, pleasant  HEENT: AT/[place], sclera clear, EOMI  Cardiovascular:  Tachycardia, no murmurs, rubs, or gallops  Respiratory: Normal effort, good air movement throughout, expiratory wheezes bilaterally Abdomen: Soft, nontender, nondistended Extremities: No LE edema or tenderness  Skin: No rash or cyanosis  Neuro: Awake, alert, no focal deficits  Laboratory:  Recent Labs Lab 02/06/13 2358 02/07/13 0027 02/07/13 1400  WBC 6.0  --  10.3  HGB 15.8 17.7* 14.9  HCT 48.0 52.0 42.6  PLT 140*  --  139*    Recent Labs Lab 02/07/13 0027 02/07/13 0558 02/07/13 1400  NA 142 139  --   K 3.9 4.0  --   CL 106 100  --   CO2  --  22  --   BUN 16 19  --   CREATININE 1.00 0.98 0.89  CALCIUM  --  9.7  --   GLUCOSE 155* 177*  --    ABG venous ph 7.26, pco2 53.6  VBG with pH 7.26 and pCO2 53.6   Imaging/Diagnostic Tests: DG 2-view 10/1: No active cardiopulmonary disease.  Chest x-ray 10/2: No active disease. No change since yesterday.  EKG sinus tach  Beverely Low, MD 02/08/2013, 7:09 AM PGY-1, [facility] Family Medicine FPTS Intern pager: [phone], text pages welcome

## 2013-02-08 NOTE — Progress Notes (Signed)
Interim progress note  SPoke with nurse for Matthew Ellison who stated caregiver requesting nebulizer machine for patient. Ordered home health DME nebulizer machine. Encouraged this to be discussed with PCP as it may not be necessary and inhaler with spacer may be all that is necessary for patient with good ability to administer/follow instructions. Also ordered nebulizer solution. Nurse spoke with CM about this.  Leona Singleton, MD

## 2013-02-08 NOTE — Pediatric Asthma Action Plan (Signed)
[facility] PEDIATRIC ASTHMA ACTION PLAN  [place] PEDIATRIC TEACHING SERVICE  (PEDIATRICS)  [phone]  Chaitanya Amedee 30-Jan-1976  Follow-up Information   Follow up with Street, Cristal Deer, MD. Schedule an appointment as soon as possible for a visit on 02/15/2013. (3pm)    Specialty:  Family Medicine   Contact information:   [address] [place] [address] [phone]      Provider/clinic/office name: see above Telephone number : see above  Remember! Always use a spacer with your metered dose inhaler! GREEN = GO!                                   Use these medications every day!  - Breathing is good  - No cough or wheeze day or night  - Can work, sleep, exercise  Rinse your mouth after inhalers as directed Advair Use 15 minutes before exercise or trigger exposure  Albuterol (Proventil, Ventolin, Proair) 2 puffs as needed every 4 hours    YELLOW = asthma out of control   Continue to use Green Zone medicines & add:  - Cough or wheeze  - Tight chest  - Short of breath  - Difficulty breathing  - First sign of a cold (be aware of your symptoms)  Call for advice as you need to.  Quick Relief Medicine:Albuterol (Proventil, Ventolin, Proair) 2 puffs as needed every 4 hours If you improve within 20 minutes, continue to use every 4 hours as needed until completely well. Call if you are not better in 2 days or you want more advice.  If no improvement in 15-20 minutes, repeat quick relief medicine every 20 minutes for 2 more treatments (for a maximum of 3 total treatments in 1 hour). If improved continue to use every 4 hours and CALL for advice.  If not improved or you are getting worse, follow Red Zone plan.  Special Instructions:   RED = DANGER                                Get help from a doctor now!  - Albuterol not helping or not lasting 4 hours  - Frequent, severe cough  - Getting worse instead of better  - Ribs or neck muscles show when breathing in  -  Hard to walk and talk  - Lips or fingernails turn blue TAKE: Albuterol 4 puffs of inhaler with spacer If breathing is better within 15 minutes, repeat emergency medicine every 15 minutes for 2 more doses. YOU MUST CALL FOR ADVICE NOW!   STOP! MEDICAL ALERT!  If still in Red (Danger) zone after 15 minutes this could be a life-threatening emergency. Take second dose of quick relief medicine  AND  Go to the Emergency Room or call 911  If you have trouble walking or talking, are gasping for air, or have blue lips or fingernails, CALL 911!I  Continue albuterol treatments every 4-6 hours for the next 3 days  Environmental Control and Control of other Triggers  Allergens  Animal Dander Some people are allergic to the flakes of skin or dried saliva from animals with fur or feathers. The best thing to do: . Keep furred or feathered pets out of your home.   If you can't keep the pet outdoors, then: . Keep the pet out of your bedroom and other sleeping areas at all  times, and keep the door closed. . Remove carpets and furniture covered with cloth from your home.   If that is not possible, keep the pet away from fabric-covered furniture   and carpets.  Dust Mites Many people with asthma are allergic to dust mites. Dust mites are tiny bugs that are found in every home-in mattresses, pillows, carpets, upholstered furniture, bedcovers, clothes, stuffed toys, and fabric or other fabric-covered items. Things that can help: . Encase your mattress in a special dust-proof cover. . Encase your pillow in a special dust-proof cover or wash the pillow each week in hot water. Water must be hotter than 130 F to kill the mites. Cold or warm water used with detergent and bleach can also be effective. . Wash the sheets and blankets on your bed each week in hot water. . Reduce indoor humidity to below 60 percent (ideally between 30-50 percent). Dehumidifiers or central air conditioners can do this. . Try  not to sleep or lie on cloth-covered cushions. . Remove carpets from your bedroom and those laid on concrete, if you can. Marland Kitchen Keep stuffed toys out of the bed or wash the toys weekly in hot water or   cooler water with detergent and bleach.  Cockroaches Many people with asthma are allergic to the dried droppings and remains of cockroaches. The best thing to do: . Keep food and garbage in closed containers. Never leave food out. . Use poison baits, powders, gels, or paste (for example, boric acid).   You can also use traps. . If a spray is used to kill roaches, stay out of the room until the odor   goes away.  Indoor Mold . Fix leaky faucets, pipes, or other sources of water that have mold   around them. . Clean moldy surfaces with a cleaner that has bleach in it.   Pollen and Outdoor Mold  What to do during your allergy season (when pollen or mold spore counts are high) . Try to keep your windows closed. . Stay indoors with windows closed from late morning to afternoon,   if you can. Pollen and some mold spore counts are highest at that time. . Ask your doctor whether you need to take or increase anti-inflammatory   medicine before your allergy season starts.  Irritants  Tobacco Smoke . If you smoke, ask your doctor for ways to help you quit. Ask family   members to quit smoking, too. . Do not allow smoking in your home or car.  Smoke, Strong Odors, and Sprays . If possible, do not use a wood-burning stove, kerosene heater, or fireplace. . Try to stay away from strong odors and sprays, such as perfume, talcum    powder, hair spray, and paints.  Other things that bring on asthma symptoms in some people include:  Vacuum Cleaning . Try to get someone else to vacuum for you once or twice a week,   if you can. Stay out of rooms while they are being vacuumed and for   a short while afterward. . If you vacuum, use a dust mask (from a hardware store), a double-layered   or  microfilter vacuum cleaner bag, or a vacuum cleaner with a HEPA filter.  Other Things That Can Make Asthma Worse . Sulfites in foods and beverages: Do not drink beer or wine or eat dried   fruit, processed potatoes, or shrimp if they cause asthma symptoms. . Cold air: Cover your nose and mouth with a scarf on  cold or windy days. . Other medicines: Tell your doctor about all the medicines you take.   Include cold medicines, aspirin, vitamins and other supplements, and   nonselective beta-blockers (including those in eye drops).  I have reviewed the asthma action plan with the patient and caregiver(s) and provided them with a copy.  Beverely Low

## 2013-02-09 NOTE — Discharge Summary (Signed)
Family Medicine Teaching [facility] Discharge Summary  Patient name: Matthew Ellison Medical record number: [id number] Date of birth: 1976/04/15 Age: 37 y.o. Gender: male Date of Admission: 02/06/2013  Date of Discharge: 02/08/13 Admitting Physician: Nestor Ramp, MD  Primary Care Provider: Maryjean Ka, MD Consultants: none  Indication for Hospitalization: Asthma exacerbation  Discharge Diagnoses/Problem List:  Patient Active Problem List   Diagnosis Date Noted  . Asthma with status asthmaticus 02/07/2013  . ECZEMA 08/12/2008  . RHINITIS 06/18/2007  . 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 Unspecified asthma 07/06/2006   Disposition: Home  Discharge Condition: Improved  Discharge Exam:  General: NAD, pleasant  HEENT: AT/[place], sclera clear, EOMI  Cardiovascular: Tachycardia, no murmurs, rubs, or gallops  Respiratory: Normal effort, good air movement throughout, expiratory wheezes bilaterally  Abdomen: Soft, nontender, nondistended  Extremities: No LE edema or tenderness  Skin: No rash or cyanosis  Neuro: Awake, alert, no focal deficits  Brief Hospital Course:  Pt was admitted with status asthmaticus requiring several hours of continuous albuterol in the ED and step down. He was able to transition to q4 hours albuterol and atrovent nebs overnight and required no PRNs. He required supplemental O2 for the first day of hospitalization and this was weaned on day of discharge to [facility].  Issues for Follow Up: Family requested nebulizer for home and it was given. Counsel them about spacer use and good inhaler technique as that should be all he needs. Sent out with instructions to use scheduled albuterol and prednisone for 3 more days to complete 5 day burst.  Significant Procedures: none  Significant Labs and Imaging:   Recent Labs Lab 02/06/13 2358 02/07/13 0027 02/07/13 1400  WBC 6.0  --  10.3  HGB 15.8 17.7* 14.9  HCT 48.0 52.0 42.6  PLT 140*  --  139*    Recent  Labs Lab 02/07/13 0027 02/07/13 0558 02/07/13 1400  NA 142 139  --   K 3.9 4.0  --   CL 106 100  --   CO2  --  22  --   GLUCOSE 155* 177*  --   BUN 16 19  --   CREATININE 1.00 0.98 0.89  CALCIUM  --  9.7  --    EKG: Sinus tachycardia  Results/Tests Pending at Time of Discharge: none  Discharge Medications:    Medication List         albuterol (2.5 MG/3ML) 0.083% nebulizer solution  Commonly known as:  PROVENTIL  Take 3 mLs (2.5 mg total) by nebulization every 4 (four) hours as needed for wheezing or shortness of breath (please include nebulizer machine, hoses, and mask if needed.).     albuterol 108 (90 BASE) MCG/ACT inhaler  Commonly known as:  PROVENTIL HFA;VENTOLIN HFA  Inhale 2 puffs into the lungs every 6 (six) hours as needed for wheezing or shortness of breath.     Fluticasone-Salmeterol 100-50 MCG/DOSE Aepb  Commonly known as:  ADVAIR  Inhale 1 puff into the lungs every 12 (twelve) hours.     predniSONE 50 MG tablet  Commonly known as:  DELTASONE  Take 1 tablet (50 mg total) by mouth daily.       Discharge Instructions: Please refer to Patient Instructions section of EMR for full details.  Patient was counseled important signs and symptoms that should prompt return to medical care, changes in medications, dietary instructions, activity restrictions, and follow up appointments.   Follow-Up Appointments:     Follow-up Information   Follow up with  Street, Cristal Deer, MD On 02/15/2013. (3pm)    Specialty:  Family Medicine   Contact information:   [address] [place] [address] [phone]       Beverely Low, MD 02/09/2013, 10:20 AM PGY-1, [facility] Family Medicine

## 2013-02-09 NOTE — Discharge Summary (Signed)
FMTS Attending Admission Note: Julie-Anne Torain,MD I  have seen and examined this patient, reviewed their chart. I have discussed this patient with the resident. I agree with the resident's findings, assessment and care plan.  

## 2013-02-10 ENCOUNTER — Telehealth: Payer: Self-pay | Admitting: Family Medicine

## 2013-02-10 DIAGNOSIS — J45909 Unspecified asthma, uncomplicated: Secondary | ICD-10-CM

## 2013-02-10 DIAGNOSIS — J45902 Unspecified asthma with status asthmaticus: Secondary | ICD-10-CM

## 2013-02-10 MED ORDER — FLUTICASONE-SALMETEROL 100-50 MCG/DOSE IN AEPB: 1.0000 | INHALATION_SPRAY | Freq: Two times a day (BID) | RESPIRATORY_TRACT | Status: DC

## 2013-02-10 NOTE — Telephone Encounter (Signed)
[facility] Family Medicine Residency After Hours Line.   When discharged, did not know where albuterol and advair rx were sent to. Informed of [facility] on [address] for albuterol and i refilled advair today as well.   Aldine Contes. Marti Sleigh, MD, PGY2 02/10/2013 7:00 PM

## 2013-02-15 ENCOUNTER — Ambulatory Visit (INDEPENDENT_AMBULATORY_CARE_PROVIDER_SITE_OTHER): Payer: Medicare Other | Admitting: Family Medicine

## 2013-02-15 ENCOUNTER — Encounter: Payer: Self-pay | Admitting: Family Medicine

## 2013-02-15 VITALS — BP 118/84 | HR 105 | Ht 63.0 in | Wt 107.7 lb

## 2013-02-15 DIAGNOSIS — J45909 Unspecified asthma, uncomplicated: Secondary | ICD-10-CM

## 2013-02-15 DIAGNOSIS — J309 Allergic rhinitis, unspecified: Secondary | ICD-10-CM

## 2013-02-15 MED ORDER — ALBUTEROL SULFATE HFA 108 (90 BASE) MCG/ACT IN AERS: 2.0000 | INHALATION_SPRAY | Freq: Four times a day (QID) | RESPIRATORY_TRACT | Status: DC | PRN

## 2013-02-15 MED ORDER — CETIRIZINE HCL 10 MG PO TBDP: 10.0000 mg | ORAL_TABLET | Freq: Every day | ORAL | Status: DC

## 2013-02-15 NOTE — Patient Instructions (Addendum)
Thank you for coming in, today!  I will call your pharmacy to see about helping you get a replacement inhaler. I will give you a paper prescription to fill next month or whenever your insurance will refill it next, if I can't get you a new one sooner. Start taking Zyrtec (cetirizine) every day, to help with your allergies. I also put in a referral for you to see the allergy specialist.  Make an appointment to see me whenever you need. Please feel free to call with any questions or concerns at any time, at [phone]. --Dr. Casper Harrison

## 2013-02-17 NOTE — Progress Notes (Signed)
  Subjective:    Patient ID: Matthew Ellison, male    DOB: 1976/05/06, 37 y.o.   MRN: [id number]  HPI: Pt presents to clinic for hospital f/u, admitted recently for asthma exacerbation. Generally has been feeling well since discharge, though some occasional headaches (mostly on the left, not worse with light/sound, not taking anything for it; attributes some to sinus congestion/allergies, but not taking a regular allergy medicine). Pt has been taking Advair regularly and using occasional albuterol nebulizer at home, but lost his albuterol MDI at the ED and was unable to get a refill at his pharmacy due to his insurance saying it was "too early."   Generally feels hs is feeling better, and definitely feels his breathing is better. Denies fever/chills, N/V. Is interested in seeing an asthma/allergy specialist, since he and his aunt feel his allergies are worse than before, but he can't identify specific triggers (previously thought neighbors' cats were a trigger, but "they moved away, and things haven't been any different.")  Review of Systems: As above. Generally feels well.     Objective:   Physical Exam BP 118/84  Pulse 105  Ht 5\' 3"  (1.6 m)  Wt 107 lb 11.2 oz (48.852 kg)  BMI 19.08 kg/m2  SpO2 98% Gen: well-appearing adult male, in NAD HEENT: nasal and posterior oropharyngeal mucosae mildly inflamed but without exudate, some loud congestion, mild maxillary sinus tenderness Neuro/psych: nonfocal exam without obvious deficits; some slow mentation (baseline) but alert/oriented, mood/affect euthymic Chest: heart with RRR, no murmur, lungs CTAB throughout all fields and no wheezes, normal WOB Abd: soft, nontender, nondistended, BS+ Skin: warm, dry, intact, no cyanosis or edema     Assessment & Plan:

## 2013-02-17 NOTE — Assessment & Plan Note (Signed)
A: Recent exacerbation, finished course of prednisone per hospital providers. Generally doing well with scheduled Advair and occasional albuterol. Lost MDI in the ED.  P: Continue current medications. New paper Rx for MDI given and called pharmacy to see it pt will be able to get an override for "early" refill; pharmacist to f/u with pt when he arrives to pick up Rx for Zyrtec (see other problem list note from this visit). Follow up PRN.

## 2013-02-17 NOTE — Assessment & Plan Note (Signed)
Possible contribution of seasonal and/or unspecified allergies, which may also contribute to asthma symptoms. Unable to identify definite/specific triggers. Rx for regular/daily-scheduled Zyrtec, referred to asthma/allergy specialist. Follow up as needed.

## 2014-01-28 ENCOUNTER — Ambulatory Visit (INDEPENDENT_AMBULATORY_CARE_PROVIDER_SITE_OTHER): Payer: Medicare Other | Admitting: *Deleted

## 2014-01-28 DIAGNOSIS — Z23 Encounter for immunization: Secondary | ICD-10-CM

## 2015-01-30 DIAGNOSIS — J45901 Unspecified asthma with (acute) exacerbation: Secondary | ICD-10-CM | POA: Insufficient documentation

## 2015-01-30 DIAGNOSIS — H1010 Acute atopic conjunctivitis, unspecified eye: Secondary | ICD-10-CM | POA: Insufficient documentation

## 2015-01-30 DIAGNOSIS — J309 Allergic rhinitis, unspecified: Secondary | ICD-10-CM

## 2015-02-09 ENCOUNTER — Other Ambulatory Visit: Payer: Self-pay | Admitting: Allergy and Immunology

## 2015-02-09 DIAGNOSIS — J45909 Unspecified asthma, uncomplicated: Secondary | ICD-10-CM

## 2015-02-09 MED ORDER — MOMETASONE FURO-FORMOTEROL FUM 200-5 MCG/ACT IN AERO: 2.0000 | INHALATION_SPRAY | Freq: Every day | RESPIRATORY_TRACT | Status: DC

## 2015-02-09 NOTE — Telephone Encounter (Signed)
Received as fax. Sent as e-refill. Patient needs office visit.

## 2015-02-10 ENCOUNTER — Encounter: Payer: Medicare Other | Admitting: Allergy and Immunology

## 2015-02-11 NOTE — Progress Notes (Signed)
This encounter was created in error - please disregard.

## 2015-02-13 ENCOUNTER — Ambulatory Visit: Payer: Medicare Other

## 2015-03-13 ENCOUNTER — Ambulatory Visit: Payer: Medicare Other

## 2015-03-13 ENCOUNTER — Ambulatory Visit (INDEPENDENT_AMBULATORY_CARE_PROVIDER_SITE_OTHER): Payer: Medicare Other | Admitting: Family Medicine

## 2015-03-13 ENCOUNTER — Encounter: Payer: Self-pay | Admitting: Family Medicine

## 2015-03-13 VITALS — BP 116/72 | HR 66 | Temp 98.7°F | Wt 119.0 lb

## 2015-03-13 DIAGNOSIS — R51 Headache: Secondary | ICD-10-CM | POA: Diagnosis not present

## 2015-03-13 DIAGNOSIS — Z23 Encounter for immunization: Secondary | ICD-10-CM

## 2015-03-13 DIAGNOSIS — R519 Headache, unspecified: Secondary | ICD-10-CM | POA: Insufficient documentation

## 2015-03-13 NOTE — Patient Instructions (Signed)
It was great seeing you today.   1. We will get a CT scan of your head to further evaluate your headaches. I will call you once I have the results.  2. In the meantime, continue to take tylenol as needed for Headaches.  3. Call the clinic if your headaches become intractable or you develop numbness or weakness in your arms or legs.   If you have any questions or concerns before then, please call the clinic at [phone].  Take Care,   Dr Phill Myron
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
## 2015-03-17 ENCOUNTER — Telehealth: Payer: Self-pay | Admitting: Family Medicine

## 2015-03-17 ENCOUNTER — Ambulatory Visit (HOSPITAL_COMMUNITY)
Admission: RE | Admit: 2015-03-17 | Discharge: 2015-03-17 | Disposition: A | Discharge status: Home / Self Care | Payer: Medicare Other | Source: Ambulatory Visit | Attending: Family Medicine | Admitting: Family Medicine

## 2015-03-17 ENCOUNTER — Encounter (HOSPITAL_COMMUNITY): Payer: Self-pay

## 2015-03-17 DIAGNOSIS — R51 Headache: Secondary | ICD-10-CM | POA: Diagnosis present

## 2015-03-17 DIAGNOSIS — R519 Headache, unspecified: Secondary | ICD-10-CM

## 2015-03-17 NOTE — Telephone Encounter (Signed)
Called to discuss his CT head results, but he was not home. Left message with his grandmother to have him call the clinic for his results.

## 2015-03-26 ENCOUNTER — Other Ambulatory Visit: Payer: Self-pay | Admitting: Neurology

## 2015-03-26 DIAGNOSIS — J45909 Unspecified asthma, uncomplicated: Secondary | ICD-10-CM

## 2015-03-26 MED ORDER — MOMETASONE FURO-FORMOTEROL FUM 200-5 MCG/ACT IN AERO: 2.0000 | INHALATION_SPRAY | Freq: Every day | RESPIRATORY_TRACT | Status: DC

## 2015-05-26 ENCOUNTER — Other Ambulatory Visit: Payer: Self-pay

## 2015-05-26 MED ORDER — MOMETASONE FURO-FORMOTEROL FUM 200-5 MCG/ACT IN AERO
2.0000 | INHALATION_SPRAY | Freq: Every day | RESPIRATORY_TRACT | Status: DC
Start: 2015-05-26 — End: 2016-05-31

## 2015-06-01 ENCOUNTER — Other Ambulatory Visit: Payer: Self-pay | Admitting: Neurology

## 2015-06-02 ENCOUNTER — Other Ambulatory Visit: Payer: Self-pay

## 2015-06-02 DIAGNOSIS — J45909 Unspecified asthma, uncomplicated: Secondary | ICD-10-CM

## 2015-06-02 MED ORDER — MOMETASONE FURO-FORMOTEROL FUM 200-5 MCG/ACT IN AERO: 2.0000 | INHALATION_SPRAY | Freq: Every day | RESPIRATORY_TRACT | Status: DC

## 2015-06-10 ENCOUNTER — Ambulatory Visit (INDEPENDENT_AMBULATORY_CARE_PROVIDER_SITE_OTHER): Payer: Medicare Other | Admitting: Allergy and Immunology

## 2015-06-10 ENCOUNTER — Encounter: Payer: Self-pay | Admitting: Allergy and Immunology

## 2015-06-10 VITALS — BP 108/70 | HR 72 | Temp 97.9°F | Resp 16

## 2015-06-10 DIAGNOSIS — J4540 Moderate persistent asthma, uncomplicated: Secondary | ICD-10-CM

## 2015-06-10 DIAGNOSIS — H1010 Acute atopic conjunctivitis, unspecified eye: Secondary | ICD-10-CM | POA: Diagnosis not present

## 2015-06-10 DIAGNOSIS — J309 Allergic rhinitis, unspecified: Secondary | ICD-10-CM

## 2015-06-10 MED ORDER — FLUTICASONE FUROATE-VILANTEROL 200-25 MCG/INH IN AEPB: 1.0000 | INHALATION_SPRAY | Freq: Every day | RESPIRATORY_TRACT | Status: DC

## 2015-06-10 MED ORDER — ALBUTEROL SULFATE HFA 108 (90 BASE) MCG/ACT IN AERS: 2.0000 | INHALATION_SPRAY | RESPIRATORY_TRACT | Status: DC | PRN

## 2015-06-10 MED ORDER — MONTELUKAST SODIUM 10 MG PO TABS
10.0000 mg | ORAL_TABLET | Freq: Every day | ORAL | Status: DC
Start: 2015-06-10 — End: 2016-05-31

## 2015-06-10 MED ORDER — MOMETASONE FUROATE 50 MCG/ACT NA SUSP: 1.0000 | Freq: Every day | NASAL | Status: DC

## 2015-06-10 NOTE — Patient Instructions (Signed)
  1. Breo 200 - one inhalation 1 time per day. Coupon. Sample  2. Nasonex one spray each nostril one time per day  3. Montelukast 10 mg one tablet one time per day  4. Cetirizine 10 mg one tablet one time per day  5. If needed ProAir HFA 2 puffs every 4-6 hours  6. Return to clinic in 6 months or earlier if problem

## 2015-06-10 NOTE — Progress Notes (Signed)
Follow-up Note  Referring Provider: Archie Patten, MD Primary Provider: Kathrine Cords, MD Date of Office Visit: 06/10/2015  Subjective:   Matthew Ellison is a 40 y.o. male who returns to the [facility] on 06/10/2015 in re-evaluation of the following:  HPI Comments: Matthew Ellison returns to this clinic in reevaluation of his asthma and allergic rhinoconjunctivitis. For the most part he is done relatively well while using his Dulera on a consistent basis. Consistency for Matthew Ellison means that he uses it several times a week. He has not had to use a short acting bronchodilator and can exercise without any problem and has not received a systemic steroid to treat an exacerbation of his asthma. His nose is been doing quite well while occasionally using Nasonex. He does consistently use his montelukast and cetirizine. Matthew Ellison did obtain the flu vaccine this fall. Matthew Ellison can no longer receive his Matthew Ellison from his Matthew Ellison as it has been and excluded product at this point in time   Current Outpatient Prescriptions on File Prior to Visit  Medication Sig Dispense Refill  . albuterol (PROVENTIL HFA;VENTOLIN HFA) 108 (90 BASE) MCG/ACT inhaler Inhale 2 puffs into the lungs every 6 (six) hours as needed for wheezing or shortness of breath. 1 Inhaler 2  . albuterol (PROVENTIL) (2.5 MG/3ML) 0.083% nebulizer solution Take 3 mLs (2.5 mg total) by nebulization every 4 (four) hours as needed for wheezing or shortness of breath (please include nebulizer machine, hoses, and mask if needed.). 30 vial 6  . Cetirizine HCl 10 MG TBDP Take 10 mg by mouth daily. 30 tablet 3  . Fluticasone-Salmeterol (ADVAIR) 100-50 MCG/DOSE AEPB Inhale 1 puff into the lungs every 12 (twelve) hours. (Patient not taking: Reported on 06/10/2015) 60 each 5  . mometasone (NASONEX) 50 MCG/ACT nasal spray Place 1 spray into the nose daily.    . mometasone-formoterol (DULERA) 200-5 MCG/ACT AERO Inhale 2  puffs into the lungs daily. 1 Inhaler 0  . mometasone-formoterol (DULERA) 200-5 MCG/ACT AERO Inhale 2 puffs into the lungs daily. 1 Inhaler 5  . montelukast (SINGULAIR) 10 MG tablet Take 10 mg by mouth at bedtime.    . predniSONE (DELTASONE) 50 MG tablet Take 1 tablet (50 mg total) by mouth daily. (Patient not taking: Reported on 06/10/2015) 3 tablet 0   No current facility-administered medications on file prior to visit.    Past Medical History  Diagnosis Date  . Panic attacks   . Asthma     No past surgical history on file.  Not on File  Review of systems negative except as noted in HPI / PMHx or noted below:  Review of Systems  Constitutional: Negative.   HENT: Negative.   Eyes: Negative.   Respiratory: Negative.   Cardiovascular: Negative.   Gastrointestinal: Negative.   Genitourinary: Negative.   Musculoskeletal: Negative.   Skin: Negative.   Neurological: Negative.   Endo/Heme/Allergies: Negative.   Psychiatric/Behavioral: Negative.      Objective:   Filed Vitals:   06/10/15 1051  BP: 108/70  Pulse: 72  Temp: 97.9 F (36.6 C)  Resp: 16          Physical Exam  Constitutional: He is well-developed, well-nourished, and in no distress.  HENT:  Head: Normocephalic.  Right Ear: Tympanic membrane, external ear and ear canal normal.  Left Ear: Tympanic membrane, external ear and ear canal normal.  Nose: Nose normal. No mucosal edema or rhinorrhea.  Mouth/Throat: Uvula is midline, oropharynx is clear and moist  and mucous membranes are normal. No oropharyngeal exudate.  Eyes: Conjunctivae are normal.  Neck: Trachea normal. No tracheal tenderness present. No tracheal deviation present. No thyromegaly present.  Cardiovascular: Normal rate, regular rhythm, S1 normal, S2 normal and normal heart sounds.   No murmur heard. Pulmonary/Chest: Breath sounds normal. No stridor. No respiratory distress. He has no wheezes. He has no rales.  Musculoskeletal: He exhibits no  edema.  Lymphadenopathy:       Head (right side): No tonsillar adenopathy present.       Head (left side): No tonsillar adenopathy present.    He has no cervical adenopathy.    He has no axillary adenopathy.  Neurological: He is alert. Gait normal.  Skin: No rash noted. He is not diaphoretic. No erythema. Nails show no clubbing.  Psychiatric: Mood and affect normal.    Diagnostics:    Spirometry was performed and demonstrated an FEV1 of 1.80 at 72 % of predicted.  The patient had an Asthma Control Test with the following results: ACT Total Score: 22.    Assessment and Plan:   1. Asthma, moderate persistent, well-controlled   2. Allergic rhinoconjunctivitis     1. Breo 200 - one inhalation 1 time per day. Coupon. Sample  2. Nasonex one spray each nostril one time per day  3. Montelukast 10 mg one tablet one time per day  4. Cetirizine 10 mg one tablet one time per day  5. If needed ProAir HFA 2 puffs every 4-6 hours  6. Return to clinic in 6 months or earlier if problem  Luchiano will consistently use Brio and Nasonex and montelukast especially as we go through this upcoming springtime season as he is very allergic to springtime pollens. I've asked him to contact this clinic should he develop a significant problem in the face of using this plan. If he does well I will see him back in this clinic in 6 months or earlier if there is a problem.  Matthew Katz, MD [place]

## 2015-10-03 ENCOUNTER — Other Ambulatory Visit: Payer: Self-pay | Admitting: Allergy and Immunology

## 2015-11-15 ENCOUNTER — Other Ambulatory Visit: Payer: Self-pay | Admitting: Allergy and Immunology

## 2015-11-19 ENCOUNTER — Other Ambulatory Visit: Payer: Self-pay | Admitting: Allergy and Immunology

## 2015-11-20 ENCOUNTER — Other Ambulatory Visit: Payer: Self-pay | Admitting: Allergy and Immunology

## 2015-12-01 ENCOUNTER — Encounter (INDEPENDENT_AMBULATORY_CARE_PROVIDER_SITE_OTHER): Payer: Self-pay

## 2015-12-01 ENCOUNTER — Encounter: Payer: Self-pay | Admitting: Allergy and Immunology

## 2015-12-01 ENCOUNTER — Ambulatory Visit (INDEPENDENT_AMBULATORY_CARE_PROVIDER_SITE_OTHER): Payer: Medicare Other | Admitting: Allergy and Immunology

## 2015-12-01 VITALS — BP 108/70 | HR 62 | Resp 16

## 2015-12-01 DIAGNOSIS — H1010 Acute atopic conjunctivitis, unspecified eye: Secondary | ICD-10-CM | POA: Diagnosis not present

## 2015-12-01 DIAGNOSIS — J309 Allergic rhinitis, unspecified: Secondary | ICD-10-CM

## 2015-12-01 DIAGNOSIS — J4540 Moderate persistent asthma, uncomplicated: Secondary | ICD-10-CM | POA: Diagnosis not present

## 2015-12-01 NOTE — Progress Notes (Signed)
Follow-up Note  Referring Provider: Archie Patten, MD Primary Provider: Kathrine Cords, MD Date of Office Visit: 12/01/2015  Subjective:   Matthew Ellison (DOB: 1975-07-25) is a 40 y.o. male who returns to the [facility] [place] on 12/01/2015 in re-evaluation of the following:  HPI: Thorwald returns to this clinic in evaluation of his asthma and allergic rhinoconjunctivitis. His last visit with me in his clinic was February 2017.  During the interval he has had no problems with his asthma requiring a systemic steroid for an exacerbation and he rarely uses a short acting bronchodilator and can exert himself without much problem while consistently using his Breo. Consistent use for Markanthony means at least several times a week.  His nose has been doing quite well and he's had very little issues while using his Nasonex and montelukast. Once again there is some inconsistency about using this medication on a daily basis. He has not required any antibiotic to treat an episode of sinusitis.    Medication List      albuterol (2.5 MG/3ML) 0.083% nebulizer solution Commonly known as:  PROVENTIL Take 3 mLs (2.5 mg total) by nebulization every 4 (four) hours as needed for wheezing or shortness of breath (please include nebulizer machine, hoses, and mask if needed.).   VENTOLIN HFA 108 (90 Base) MCG/ACT inhaler Generic drug:  albuterol INHALE 2 PUFFS EVERY 4-6HRS AS NEEDED FOR WHEEZE OR COUGH   Cetirizine HCl 10 MG Tbdp Take 10 mg by mouth daily.   mometasone 50 MCG/ACT nasal spray Commonly known as:  NASONEX Place 1 spray into the nose daily.   mometasone 50 MCG/ACT nasal spray Commonly known as:  NASONEX Place 1 spray into the nose daily.   Fluticasone-Vilanterol 200 Commonly known as:  Breo Inhale 1 puffs into the lungs daily.   montelukast 10 MG tablet Commonly known as:  SINGULAIR Take 1 tablet (10 mg total) by mouth daily.       Past Medical  History:  Diagnosis Date  . Asthma   . Panic attacks     History reviewed. No pertinent surgical history.  No Known Allergies  Review of systems negative except as noted in HPI / PMHx or noted below:  Review of Systems  Constitutional: Negative.   HENT: Negative.   Eyes: Negative.   Respiratory: Negative.   Cardiovascular: Negative.   Gastrointestinal: Negative.   Genitourinary: Negative.   Musculoskeletal: Negative.   Skin: Negative.   Neurological: Negative.   Endo/Heme/Allergies: Negative.   Psychiatric/Behavioral: Negative.      Objective:   Vitals:   12/01/15 1736  BP: 108/70  Pulse: 62  Resp: 16          Physical Exam  Constitutional: He is well-developed, well-nourished, and in no distress.  HENT:  Head: Normocephalic.  Right Ear: Tympanic membrane, external ear and ear canal normal.  Left Ear: Tympanic membrane, external ear and ear canal normal.  Nose: Nose normal. No mucosal edema or rhinorrhea.  Mouth/Throat: Uvula is midline, oropharynx is clear and moist and mucous membranes are normal. No oropharyngeal exudate.  Eyes: Conjunctivae are normal.  Neck: Trachea normal. No tracheal tenderness present. No tracheal deviation present. No thyromegaly present.  Cardiovascular: Normal rate, regular rhythm, S1 normal, S2 normal and normal heart sounds.   No murmur heard. Pulmonary/Chest: Breath sounds normal. No stridor. No respiratory distress. He has no wheezes. He has no rales.  Musculoskeletal: He exhibits no edema.  Lymphadenopathy:  Head (right side): No tonsillar adenopathy present.       Head (left side): No tonsillar adenopathy present.    He has no cervical adenopathy.  Neurological: He is alert. Gait normal.  Skin: No rash noted. He is not diaphoretic. No erythema. Nails show no clubbing.  Psychiatric: Mood and affect normal.    Diagnostics:    Spirometry was performed and demonstrated an FEV1 of 2.49 at 48 % of predicted. He had a  very difficult time with the spirometric maneuver.  The patient had an Asthma Control Test with the following results: ACT Total Score: 21.    Assessment and Plan:   1. Asthma, moderate persistent, well-controlled   2. Allergic rhinoconjunctivitis     1. Breo 200 - one inhalation 1 time per day.    2. Nasonex one spray each nostril one time per day  3. Montelukast 10 mg one tablet one time per day  4. Cetirizine 10 mg one tablet one time per day  5. If needed Ventolin HFA 2 puffs every 4-6 hours  6. Return to clinic in 6 months or earlier if problem  7. Obtain fall flu vaccine  [name] appears to be doing okay and we'll keep him on his Breo, Nasonex, and montelukast as this has resulted in pretty good control of his respiratory tract disease and I'll see him back in this clinic in approximately 6 months or earlier if there is a problem.  [name], MD [place]

## 2015-12-01 NOTE — Patient Instructions (Signed)
  1. Breo 200 - one inhalation 1 time per day.    2. Nasonex one spray each nostril one time per day  3. Montelukast 10 mg one tablet one time per day  4. Cetirizine 10 mg one tablet one time per day  5. If needed Ventolin HFA 2 puffs every 4-6 hours  6. Return to clinic in 6 months or earlier if problem  7. Obtain fall flu vaccine

## 2015-12-02 MED ORDER — MONTELUKAST SODIUM 10 MG PO TABS: 10.0000 mg | ORAL_TABLET | Freq: Every day | ORAL | 5 refills | Status: DC

## 2015-12-07 ENCOUNTER — Telehealth: Payer: Self-pay

## 2015-12-07 ENCOUNTER — Other Ambulatory Visit: Payer: Self-pay

## 2015-12-07 MED ORDER — ALBUTEROL SULFATE HFA 108 (90 BASE) MCG/ACT IN AERS: 2.0000 | INHALATION_SPRAY | Freq: Four times a day (QID) | RESPIRATORY_TRACT | 1 refills | Status: DC | PRN

## 2015-12-07 NOTE — Telephone Encounter (Signed)
Pt seen on 12/01/15 by Dr. Neldon Mc. Pt went to the pharmacy to pick up VENTOLIN HFA 108 (90 Base) MCG/ACT inhaler  But there was not a prescription at [facility] on [address]

## 2015-12-07 NOTE — Telephone Encounter (Signed)
Sent in refill

## 2015-12-09 ENCOUNTER — Other Ambulatory Visit: Payer: Self-pay | Admitting: Allergy and Immunology

## 2016-01-26 ENCOUNTER — Other Ambulatory Visit: Payer: Self-pay | Admitting: Allergy and Immunology

## 2016-03-14 ENCOUNTER — Ambulatory Visit: Payer: Self-pay

## 2016-03-23 ENCOUNTER — Telehealth: Payer: Self-pay | Admitting: *Deleted

## 2016-03-23 MED ORDER — FLUTICASONE PROPIONATE 50 MCG/ACT NA SUSP: 2.0000 | Freq: Every day | NASAL | 1 refills | Status: DC

## 2016-03-23 NOTE — Telephone Encounter (Signed)
Changed Nasonex to Fluticasone due to formulary change

## 2016-03-29 ENCOUNTER — Ambulatory Visit (INDEPENDENT_AMBULATORY_CARE_PROVIDER_SITE_OTHER): Payer: Medicare Other | Admitting: *Deleted

## 2016-03-29 DIAGNOSIS — Z23 Encounter for immunization: Secondary | ICD-10-CM

## 2016-04-19 ENCOUNTER — Ambulatory Visit: Payer: Self-pay | Admitting: Allergy and Immunology

## 2016-05-04 ENCOUNTER — Other Ambulatory Visit: Payer: Self-pay | Admitting: Allergy and Immunology

## 2016-05-31 ENCOUNTER — Encounter: Payer: Self-pay | Admitting: Allergy and Immunology

## 2016-05-31 ENCOUNTER — Ambulatory Visit (INDEPENDENT_AMBULATORY_CARE_PROVIDER_SITE_OTHER): Payer: Medicare Other | Admitting: Allergy and Immunology

## 2016-05-31 ENCOUNTER — Encounter (INDEPENDENT_AMBULATORY_CARE_PROVIDER_SITE_OTHER): Payer: Self-pay

## 2016-05-31 VITALS — BP 102/78 | HR 60 | Resp 20

## 2016-05-31 DIAGNOSIS — J4540 Moderate persistent asthma, uncomplicated: Secondary | ICD-10-CM

## 2016-05-31 DIAGNOSIS — J3089 Other allergic rhinitis: Secondary | ICD-10-CM | POA: Diagnosis not present

## 2016-05-31 DIAGNOSIS — K219 Gastro-esophageal reflux disease without esophagitis: Secondary | ICD-10-CM | POA: Diagnosis not present

## 2016-05-31 MED ORDER — ALBUTEROL SULFATE HFA 108 (90 BASE) MCG/ACT IN AERS
INHALATION_SPRAY | RESPIRATORY_TRACT | 1 refills | Status: DC
Start: 2016-05-31 — End: 2016-09-15

## 2016-05-31 MED ORDER — CETIRIZINE HCL 10 MG PO TABS: 10.0000 mg | ORAL_TABLET | Freq: Every day | ORAL | 5 refills | Status: DC | PRN

## 2016-05-31 MED ORDER — OMEPRAZOLE 40 MG PO CPDR
40.0000 mg | DELAYED_RELEASE_CAPSULE | Freq: Every day | ORAL | 5 refills | Status: DC
Start: 2016-05-31 — End: 2017-04-19

## 2016-05-31 NOTE — Progress Notes (Signed)
Follow-up Note  Referring Provider: Archie Patten, MD Primary Provider: Kathrine Cords, MD Date of Office Visit: 05/31/2016  Subjective:   Matthew Ellison (DOB: [birth year]) is a 41 y.o. male who returns to the [facility] [place] on 05/31/2016 in re-evaluation of the following:  HPI: Matthew Ellison returns to this clinic in reevaluation of his asthma and allergic rhinoconjunctivitis. I last saw him in this clinic in July 2017.  Matthew Ellison has not had an exacerbation of his asthma requiring a systemic steroid and Matthew Ellison rarely uses a short acting bronchodilator and Matthew Ellison can exercise without any difficulty. Matthew Ellison uses Breo most days of the week.  Matthew Ellison's had very little problems with his nose although Matthew Ellison does occasionally has some sneezing. Matthew Ellison has not require an antibody to treat an episode of sinusitis. Matthew Ellison continues on Nasonex and montelukast.  Matthew Ellison has been having problems with sore throat on occasion. As well, Matthew Ellison has lots of regurgitation and Matthew Ellison does have heartburn but Matthew Ellison does not treat this issue.  Matthew Ellison did receive the flu vaccine this year.  Allergies as of 05/31/2016   No Known Allergies     Medication List      albuterol (2.5 MG/3ML) 0.083% nebulizer solution Commonly known as:  PROVENTIL Take 3 mLs (2.5 mg total) by nebulization every 4 (four) hours as needed for wheezing or shortness of breath (please include nebulizer machine, hoses, and mask if needed.).   albuterol 108 (90 Base) MCG/ACT inhaler Commonly known as:  VENTOLIN HFA Inhale two puffs every 4-6 hours if needed for coughing, wheezing, or shortness of breath.   fluticasone 50 MCG/ACT nasal spray Commonly known as:  FLONASE Place 2 sprays into both nostrils daily.   fluticasone furoate-vilanterol 200-25 MCG/INH Aepb Commonly known as:  BREO ELLIPTA Inhale 1 puff into the lungs daily.   montelukast 10 MG tablet Commonly known as:  SINGULAIR Take 1 tablet (10 mg total) by mouth at bedtime.       Past Medical  History:  Diagnosis Date  . Asthma   . Panic attacks     History reviewed. No pertinent surgical history.  Review of systems negative except as noted in HPI / PMHx or noted below:  Review of Systems  Constitutional: Negative.   HENT: Negative.   Eyes: Negative.   Respiratory: Negative.   Cardiovascular: Negative.   Gastrointestinal: Negative.   Genitourinary: Negative.   Musculoskeletal: Negative.   Skin: Negative.   Neurological: Negative.   Endo/Heme/Allergies: Negative.   Psychiatric/Behavioral: Negative.      Objective:   Vitals:   05/31/16 1705  BP: 102/78  Pulse: 60  Resp: 20          Physical Exam  Constitutional: Matthew Ellison is well-developed, well-nourished, and in no distress.  HENT:  Head: Normocephalic.  Right Ear: Tympanic membrane, external ear and ear canal normal.  Left Ear: Tympanic membrane, external ear and ear canal normal.  Nose: Nose normal. No mucosal edema or rhinorrhea.  Mouth/Throat: Uvula is midline, oropharynx is clear and moist and mucous membranes are normal. No oropharyngeal exudate.  Eyes: Conjunctivae are normal.  Neck: Trachea normal. No tracheal tenderness present. No tracheal deviation present. No thyromegaly present.  Cardiovascular: Normal rate, regular rhythm, S1 normal, S2 normal and normal heart sounds.   No murmur heard. Pulmonary/Chest: Breath sounds normal. No stridor. No respiratory distress. Matthew Ellison has no wheezes. Matthew Ellison has no rales.  Musculoskeletal: Matthew Ellison exhibits no edema.  Lymphadenopathy:       Head (  right side): No tonsillar adenopathy present.       Head (left side): No tonsillar adenopathy present.    Matthew Ellison has no cervical adenopathy.  Neurological: Matthew Ellison is alert. Gait normal.  Skin: No rash noted. Matthew Ellison is not diaphoretic. No erythema. Nails show no clubbing.  Psychiatric: Mood and affect normal.    Diagnostics:    Spirometry was performed and demonstrated an FEV1 of 1.31 at 53 % of predicted. Matthew Ellison had a difficult time performing  the spirometric maneuver   Assessment and Plan:   1. Asthma, moderate persistent, well-controlled   2. Other allergic rhinitis   3. LPRD (laryngopharyngeal reflux disease)     1. Continue Breo 200 - one inhalation 1 time per day.    2. Continue Nasonex one spray each nostril one time per day  3. Continue Montelukast 10 mg one tablet one time per day  4. If needed Cetirizine 10 mg one tablet one time per day  5. If needed Ventolin HFA 2 puffs every 4-6 hours  6. Start Omeprazole 40mg  one tablet one time per day  7. Return to clinic in 6 months or earlier if problem  Najee appears to be doing relatively well regarding his atopic respiratory disease and we will keep him on anti-inflammatory medications for his respiratory tract at this point in time. Matthew Ellison does have intermittent sore throat and has regurgitation and I'll empirically treat him for LPR with the use of omeprazole and Matthew Ellison will keep in contact with me noting his response to this form of treatment. If Matthew Ellison does well I'll see him back in this clinic in 6 months or earlier if there is a problem.  [name], MD [place]

## 2016-05-31 NOTE — Patient Instructions (Signed)
  1. Continue Breo 200 - one inhalation 1 time per day.    2. Continue Nasonex one spray each nostril one time per day  3. Continue Montelukast 10 mg one tablet one time per day  4. If needed Cetirizine 10 mg one tablet one time per day  5. If needed Ventolin HFA 2 puffs every 4-6 hours  6. Start Omeprazole 40mg  one tablet one time per day  7. Return to clinic in 6 months or earlier if problem

## 2016-09-15 ENCOUNTER — Other Ambulatory Visit: Payer: Self-pay | Admitting: Allergy and Immunology
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
## 2016-12-12 ENCOUNTER — Other Ambulatory Visit: Payer: Self-pay | Admitting: Allergy and Immunology

## 2016-12-14 ENCOUNTER — Ambulatory Visit: Payer: Self-pay | Admitting: Allergy and Immunology

## 2016-12-19 ENCOUNTER — Other Ambulatory Visit: Payer: Self-pay

## 2016-12-19 MED ORDER — MONTELUKAST SODIUM 10 MG PO TABS: 10.0000 mg | ORAL_TABLET | Freq: Every day | ORAL | 1 refills | Status: DC

## 2016-12-19 NOTE — Telephone Encounter (Signed)
Received a fax from [facility] in regards to continuing Montelukast. In Dr. Bruna Potter last note it stated to continue Montelukast. I sent refill in for Montelukast.

## 2016-12-27 ENCOUNTER — Ambulatory Visit: Payer: Self-pay | Admitting: Allergy and Immunology

## 2017-03-06 ENCOUNTER — Other Ambulatory Visit: Payer: Self-pay

## 2017-03-16 ENCOUNTER — Other Ambulatory Visit: Payer: Self-pay | Admitting: Allergy and Immunology

## 2017-04-03 ENCOUNTER — Other Ambulatory Visit: Payer: Self-pay | Admitting: Allergy and Immunology

## 2017-04-03 NOTE — Telephone Encounter (Signed)
Received fax for 90 day supply for montelukast. Patient was last seen 05/31/2016. Patient needs office visit.

## 2017-04-19 ENCOUNTER — Encounter: Payer: Self-pay | Admitting: Allergy and Immunology

## 2017-04-19 ENCOUNTER — Ambulatory Visit: Payer: Medicare Other | Admitting: Allergy and Immunology

## 2017-04-19 ENCOUNTER — Ambulatory Visit (INDEPENDENT_AMBULATORY_CARE_PROVIDER_SITE_OTHER): Payer: Medicare Other | Admitting: Allergy and Immunology

## 2017-04-19 VITALS — BP 110/76 | HR 82 | Resp 17

## 2017-04-19 DIAGNOSIS — K219 Gastro-esophageal reflux disease without esophagitis: Secondary | ICD-10-CM

## 2017-04-19 DIAGNOSIS — J3089 Other allergic rhinitis: Secondary | ICD-10-CM | POA: Diagnosis not present

## 2017-04-19 DIAGNOSIS — J4540 Moderate persistent asthma, uncomplicated: Secondary | ICD-10-CM | POA: Diagnosis not present

## 2017-04-19 MED ORDER — FLUTICASONE PROPIONATE 50 MCG/ACT NA SUSP: 2.0000 | Freq: Every day | NASAL | 5 refills | Status: DC

## 2017-04-19 MED ORDER — FLUTICASONE FUROATE-VILANTEROL 200-25 MCG/INH IN AEPB: 1.0000 | INHALATION_SPRAY | Freq: Every day | RESPIRATORY_TRACT | 5 refills | Status: DC

## 2017-04-19 MED ORDER — OMEPRAZOLE 40 MG PO CPDR: 40.0000 mg | DELAYED_RELEASE_CAPSULE | Freq: Every day | ORAL | 5 refills | Status: DC

## 2017-04-19 MED ORDER — ALBUTEROL SULFATE (2.5 MG/3ML) 0.083% IN NEBU: 2.5000 mg | INHALATION_SOLUTION | RESPIRATORY_TRACT | 5 refills | Status: DC | PRN

## 2017-04-19 MED ORDER — CETIRIZINE HCL 10 MG PO TABS: 10.0000 mg | ORAL_TABLET | Freq: Every day | ORAL | 5 refills | Status: DC | PRN

## 2017-04-19 MED ORDER — MONTELUKAST SODIUM 10 MG PO TABS: 10.0000 mg | ORAL_TABLET | Freq: Every day | ORAL | 5 refills | Status: DC

## 2017-04-19 MED ORDER — ALBUTEROL SULFATE HFA 108 (90 BASE) MCG/ACT IN AERS: INHALATION_SPRAY | RESPIRATORY_TRACT | 1 refills | Status: DC

## 2017-04-19 NOTE — Patient Instructions (Signed)
  1. Continue Breo 200 - one inhalation 1 time per day.    2. Continue Nasonex one spray each nostril one time per day  3. Continue Montelukast 10 mg one tablet one time per day  4. Continue Omeprazole 40mg  one tablet one time per day  5. If needed Cetirizine 10 mg one tablet one time per day  6. If needed Ventolin HFA 2 puffs every 4-6 hours  7. Obtain flu vaccine   8. Return to clinic in 6 months or earlier if problem

## 2017-04-19 NOTE — Progress Notes (Signed)
Follow-up Note  Referring Provider: Tonette Bihari, MD Primary Provider: Tonette Bihari, MD Date of Office Visit: 04/19/2017  Subjective:   Matthew Ellison (DOB: May 09, 1976) is a 41 y.o. male who returns to the [facility] [place] [facility] on 04/19/2017 in re-evaluation of the following:  HPI: Matthew Ellison returns to this clinic in reevaluation of his asthma and allergic rhinoconjunctivitis.  I last saw him in this clinic 31 May 2016.  During the interval he has not required a systemic steroid or antibiotic to treat any type of respiratory tract issue.  He uses a short acting bronchodilator when he is exposed to very cold weather during work time.  He works in a loading dock.  Otherwise, he rarely uses a short acting bronchodilator and can exercise without any problem.  He has had very little issues with his nose.    During his last visit in this clinic he was having problems with recurrent sore throat and regurgitation and heartburn for which we had him start omeprazole and all that issue has resolved.  Allergies as of 04/19/2017   No Known Allergies     Medication List      albuterol (2.5 MG/3ML) 0.083% nebulizer solution Commonly known as:  PROVENTIL Take 3 mLs (2.5 mg total) by nebulization every 4 (four) hours as needed for wheezing or shortness of breath (please include nebulizer machine, hoses, and mask if needed.).   VENTOLIN HFA 108 (90 Base) MCG/ACT inhaler Generic drug:  albuterol INHALE TWO PUFFS EVERY 4-6 HOURS IF NEEDED FOR COUGHING, WHEEZING, OR SHORTNESS OF BREATH.   cetirizine 10 MG tablet Commonly known as:  ZYRTEC Take 1 tablet (10 mg total) by mouth daily as needed for allergies.   fluticasone 50 MCG/ACT nasal spray Commonly known as:  FLONASE Place 2 sprays into both nostrils daily.   fluticasone furoate-vilanterol 200-25 MCG/INH Aepb Commonly known as:  BREO ELLIPTA Inhale 1 puff into the lungs daily.   montelukast 10 MG  tablet Commonly known as:  SINGULAIR Take 1 tablet (10 mg total) by mouth at bedtime.   omeprazole 40 MG capsule Commonly known as:  PRILOSEC Take 1 capsule (40 mg total) by mouth daily.       Past Medical History:  Diagnosis Date  . Asthma   . Panic attacks     History reviewed. No pertinent surgical history.  Review of systems negative except as noted in HPI / PMHx or noted below:  Review of Systems  Constitutional: Negative.   HENT: Negative.   Eyes: Negative.   Respiratory: Negative.   Cardiovascular: Negative.   Gastrointestinal: Negative.   Genitourinary: Negative.   Musculoskeletal: Negative.   Skin: Negative.   Neurological: Negative.   Endo/Heme/Allergies: Negative.   Psychiatric/Behavioral: Negative.      Objective:   Vitals:   04/19/17 1037  BP: 110/76  Pulse: 82  Resp: 17  SpO2: 94%          Physical Exam  Constitutional: He is well-developed, well-nourished, and in no distress.  HENT:  Head: Normocephalic.  Right Ear: Tympanic membrane, external ear and ear canal normal.  Left Ear: Tympanic membrane, external ear and ear canal normal.  Nose: Nose normal. No mucosal edema or rhinorrhea.  Mouth/Throat: Uvula is midline, oropharynx is clear and moist and mucous membranes are normal. No oropharyngeal exudate.  Eyes: Conjunctivae are normal.  Neck: Trachea normal. No tracheal tenderness present. No tracheal deviation present. No thyromegaly present.  Cardiovascular: Normal rate, regular rhythm, S1  normal, S2 normal and normal heart sounds.  No murmur heard. Pulmonary/Chest: Breath sounds normal. No stridor. No respiratory distress. He has no wheezes. He has no rales.  Musculoskeletal: He exhibits no edema.  Lymphadenopathy:       Head (right side): No tonsillar adenopathy present.       Head (left side): No tonsillar adenopathy present.    He has no cervical adenopathy.  Neurological: He is alert. Gait normal.  Skin: No rash noted. He is not  diaphoretic. No erythema. Nails show no clubbing.  Psychiatric: Mood and affect normal.    Diagnostics:    Spirometry was performed and demonstrated an FEV1 of 1.30 at 48 % of predicted.  The patient had an Asthma Control Test with the following results: ACT Total Score: 16.    Assessment and Plan:   1. Asthma, moderate persistent, well-controlled   2. Other allergic rhinitis   3. LPRD (laryngopharyngeal reflux disease)     1. Continue Breo 200 - one inhalation 1 time per day.    2. Continue Nasonex one spray each nostril one time per day  3. Continue Montelukast 10 mg one tablet one time per day  4. Continue Omeprazole 40mg  one tablet one time per day  5. If needed Cetirizine 10 mg one tablet one time per day  6. If needed Ventolin HFA 2 puffs every 4-6 hours  7. Obtain flu vaccine   8. Return to clinic in 6 months or earlier if problem  Matthew Ellison appears to be doing quite well on his current plan which includes anti-inflammatory medications for both his upper and lower airway and treatment for reflux.  Assuming he continues to do well I will see him back in this clinic in 6 months or earlier if there is a problem.  [name], MD Allergy / Immunology [place]

## 2017-04-20 ENCOUNTER — Encounter: Payer: Self-pay | Admitting: Allergy and Immunology

## 2017-11-07 ENCOUNTER — Other Ambulatory Visit: Payer: Self-pay | Admitting: Allergy and Immunology

## 2017-11-29 ENCOUNTER — Ambulatory Visit: Payer: Medicare Other | Admitting: Allergy and Immunology

## 2018-02-13 ENCOUNTER — Ambulatory Visit: Payer: Medicare Other | Admitting: Allergy and Immunology

## 2018-05-08 ENCOUNTER — Other Ambulatory Visit: Payer: Self-pay | Admitting: Allergy and Immunology

## 2018-06-02 ENCOUNTER — Other Ambulatory Visit: Payer: Self-pay | Admitting: Allergy and Immunology

## 2018-06-11 ENCOUNTER — Other Ambulatory Visit: Payer: Self-pay

## 2018-06-11 MED ORDER — ALBUTEROL SULFATE HFA 108 (90 BASE) MCG/ACT IN AERS: INHALATION_SPRAY | RESPIRATORY_TRACT | 0 refills | Status: DC

## 2018-06-26 ENCOUNTER — Encounter: Payer: Self-pay | Admitting: Allergy and Immunology

## 2018-06-26 ENCOUNTER — Ambulatory Visit (INDEPENDENT_AMBULATORY_CARE_PROVIDER_SITE_OTHER): Payer: Medicare Other | Admitting: Allergy and Immunology

## 2018-06-26 VITALS — BP 110/68 | HR 81 | Resp 16 | Ht 63.0 in | Wt 141.0 lb

## 2018-06-26 DIAGNOSIS — K219 Gastro-esophageal reflux disease without esophagitis: Secondary | ICD-10-CM | POA: Diagnosis not present

## 2018-06-26 DIAGNOSIS — J3089 Other allergic rhinitis: Secondary | ICD-10-CM | POA: Diagnosis not present

## 2018-06-26 DIAGNOSIS — J4540 Moderate persistent asthma, uncomplicated: Secondary | ICD-10-CM | POA: Diagnosis not present

## 2018-06-26 MED ORDER — CETIRIZINE HCL 10 MG PO TABS: 10.0000 mg | ORAL_TABLET | Freq: Every day | ORAL | 5 refills | Status: DC | PRN

## 2018-06-26 MED ORDER — ALBUTEROL SULFATE HFA 108 (90 BASE) MCG/ACT IN AERS: 2.0000 | INHALATION_SPRAY | RESPIRATORY_TRACT | 3 refills | Status: DC | PRN

## 2018-06-26 MED ORDER — OMEPRAZOLE 40 MG PO CPDR: 40.0000 mg | DELAYED_RELEASE_CAPSULE | Freq: Every day | ORAL | 5 refills | Status: DC

## 2018-06-26 MED ORDER — MOMETASONE FUROATE 50 MCG/ACT NA SUSP: 2.0000 | Freq: Every day | NASAL | 5 refills | Status: DC

## 2018-06-26 MED ORDER — MONTELUKAST SODIUM 10 MG PO TABS: 10.0000 mg | ORAL_TABLET | Freq: Every day | ORAL | 5 refills | Status: DC

## 2018-06-26 MED ORDER — FLUTICASONE FUROATE-VILANTEROL 200-25 MCG/INH IN AEPB: 1.0000 | INHALATION_SPRAY | Freq: Every day | RESPIRATORY_TRACT | 5 refills | Status: DC

## 2018-06-26 NOTE — Progress Notes (Signed)
Follow-up Note  Referring Provider: Caroline More, DO Primary Provider: Caroline More, DO Date of Office Visit: 06/26/2018  Subjective:   Matthew Ellison (DOB: [birth year]) is a 43 y.o. male who returns to the [facility] [place] on 06/26/2018 in re-evaluation of the following:  HPI: Matthew Ellison returns to this clinic in reevaluation of asthma and allergic rhinoconjunctivitis.  His last visit to this clinic was 19 April 2017.  He has really done well during the interval without the need for any systemic steroids or antibiotics to treat any type of respiratory tract issue.  Rarely does use a short acting bronchodilator and he can exert himself without any problem.  He continues to use Breo and montelukast and Nasonex on a regular basis.  He believes that his stomach and his throat is doing very well while using omeprazole on a regular basis.  He did obtain the flu vaccine this year.  Allergies as of 06/26/2018   No Known Allergies     Medication List      albuterol 108 (90 Base) MCG/ACT inhaler Commonly known as:  PROAIR HFA Inhale 2 puffs into the lungs every 4 (four) hours as needed for wheezing or shortness of breath.   cetirizine 10 MG tablet Commonly known as:  ZYRTEC Take 1 tablet (10 mg total) by mouth daily as needed for allergies.   fluticasone 50 MCG/ACT nasal spray Commonly known as:  FLONASE Place 2 sprays into both nostrils daily.   fluticasone furoate-vilanterol 200-25 MCG/INH Aepb Commonly known as:  BREO ELLIPTA Inhale 1 puff into the lungs daily.   montelukast 10 MG tablet Commonly known as:  SINGULAIR Take 1 tablet (10 mg total) by mouth at bedtime.   omeprazole 40 MG capsule Commonly known as:  PRILOSEC Take 1 capsule (40 mg total) by mouth daily.       Past Medical History:  Diagnosis Date  . Asthma   . Panic attacks     History reviewed. No pertinent surgical history.  Review of systems negative except as noted in  HPI / PMHx or noted below:  Review of Systems  Constitutional: Negative.   HENT: Negative.   Eyes: Negative.   Respiratory: Negative.   Cardiovascular: Negative.   Gastrointestinal: Negative.   Genitourinary: Negative.   Musculoskeletal: Negative.   Skin: Negative.   Neurological: Negative.   Endo/Heme/Allergies: Negative.   Psychiatric/Behavioral: Negative.      Objective:   Vitals:   06/26/18 1746  BP: 110/68  Pulse: 81  Resp: 16  SpO2: 98%   Height: 5\' 3"  (160 cm)  Weight: 141 lb (64 kg)   Physical Exam Constitutional:      Appearance: He is not diaphoretic.  HENT:     Head: Normocephalic.     Right Ear: Tympanic membrane, ear canal and external ear normal.     Left Ear: Tympanic membrane, ear canal and external ear normal.     Nose: Nose normal. No mucosal edema or rhinorrhea.     Mouth/Throat:     Pharynx: Uvula midline. No oropharyngeal exudate.  Eyes:     Conjunctiva/sclera: Conjunctivae normal.  Neck:     Thyroid: No thyromegaly.     Trachea: Trachea normal. No tracheal tenderness or tracheal deviation.  Cardiovascular:     Rate and Rhythm: Normal rate and regular rhythm.     Heart sounds: Normal heart sounds, S1 normal and S2 normal. No murmur.  Pulmonary:     Effort: No respiratory distress.  Breath sounds: Normal breath sounds. No stridor. No wheezing or rales.  Lymphadenopathy:     Head:     Right side of head: No tonsillar adenopathy.     Left side of head: No tonsillar adenopathy.     Cervical: No cervical adenopathy.  Skin:    Findings: No erythema or rash.     Nails: There is no clubbing.   Neurological:     Mental Status: He is alert.       Diagnostics:    Spirometry was performed and demonstrated an FEV1 of 2.15 at 80 % of predicted.  The patient had an Asthma Control Test with the following results: ACT Total Score: 22.    Assessment and Plan:   1. Asthma, moderate persistent, well-controlled   2. Other allergic rhinitis    3. LPRD (laryngopharyngeal reflux disease)     1. Continue Breo 200 - one inhalation 1 time per day.    2. Continue Nasonex one spray each nostril one time per day  3. Continue Montelukast 10 mg one tablet one time per day  4. Continue Omeprazole 40mg  one tablet one time per day  5. If needed Cetirizine 10 mg one tablet one time per day  6. If needed Ventolin HFA 2 puffs every 4-6 hours  7. Return to clinic in 6 months or earlier if problem  Matthew Ellison will utilize anti-inflammatory agents for his airway as noted above.  Hopefully as he goes through this upcoming springtime season he will maintain good control of his atopic airway disease with this plan.  As well, he will continue to treat his reflux disease with a proton pump inhibitor.  If he does well I will see him back in this clinic in 6 months or earlier if there is a problem.  [name], MD Allergy / Immunology [place]

## 2018-06-26 NOTE — Patient Instructions (Addendum)
  1. Continue Breo 200 - one inhalation 1 time per day.    2. Continue Nasonex one spray each nostril one time per day  3. Continue Montelukast 10 mg one tablet one time per day  4. Continue Omeprazole 40mg  one tablet one time per day  5. If needed Cetirizine 10 mg one tablet one time per day  6. If needed Ventolin HFA 2 puffs every 4-6 hours  7. Return to clinic in 6 months or earlier if problem

## 2018-06-27 ENCOUNTER — Encounter: Payer: Self-pay | Admitting: Allergy and Immunology

## 2018-07-30 ENCOUNTER — Telehealth: Payer: Self-pay

## 2018-07-30 NOTE — Telephone Encounter (Signed)
Couldn't LVM but needs to change apt to tele-med apt and no labs needed.

## 2018-08-01 ENCOUNTER — Other Ambulatory Visit: Payer: Self-pay

## 2018-08-01 ENCOUNTER — Telehealth (INDEPENDENT_AMBULATORY_CARE_PROVIDER_SITE_OTHER): Payer: Medicare Other | Admitting: Family Medicine

## 2018-08-01 DIAGNOSIS — J4530 Mild persistent asthma, uncomplicated: Secondary | ICD-10-CM | POA: Diagnosis not present

## 2018-08-01 DIAGNOSIS — J301 Allergic rhinitis due to pollen: Secondary | ICD-10-CM

## 2018-08-01 DIAGNOSIS — Z7689 Persons encountering health services in other specified circumstances: Secondary | ICD-10-CM

## 2018-08-01 MED ORDER — FLUTICASONE FUROATE-VILANTEROL 200-25 MCG/INH IN AEPB: 1.0000 | INHALATION_SPRAY | Freq: Every day | RESPIRATORY_TRACT | 5 refills | Status: DC

## 2018-08-01 MED ORDER — ALBUTEROL SULFATE HFA 108 (90 BASE) MCG/ACT IN AERS: 2.0000 | INHALATION_SPRAY | RESPIRATORY_TRACT | 3 refills | Status: DC | PRN

## 2018-08-01 MED ORDER — CETIRIZINE HCL 10 MG PO TABS: 10.0000 mg | ORAL_TABLET | Freq: Every day | ORAL | 5 refills | Status: DC | PRN

## 2018-08-01 MED ORDER — MONTELUKAST SODIUM 10 MG PO TABS: 10.0000 mg | ORAL_TABLET | Freq: Every day | ORAL | 5 refills | Status: AC

## 2018-08-01 MED ORDER — MOMETASONE FUROATE 50 MCG/ACT NA SUSP: 2.0000 | Freq: Every day | NASAL | 5 refills | Status: DC

## 2018-08-01 MED ORDER — OMEPRAZOLE 40 MG PO CPDR: 40.0000 mg | DELAYED_RELEASE_CAPSULE | Freq: Every day | ORAL | 5 refills | Status: AC

## 2018-08-01 NOTE — Progress Notes (Signed)
Telemedicine Encounter- SOAP NOTE Established Patient  This telephone encounter was conducted with the patient's (or proxy's) verbal consent via audio telecommunications: yes/no: Yes Patient was instructed to have this encounter in a suitably private space; and to only have persons present to whom they give permission to participate. In addition, patient identity was confirmed by use of name plus two identifiers (DOB and address).  I discussed the limitations, risks, security and privacy concerns of performing an evaluation and management service by telephone and the availability of in person appointments. I also discussed with the patient that there may be a patient responsible charge related to this service. The patient expressed understanding and agreed to proceed.  I spent a total of TIME; 0 MIN TO 60 MIN: 15 minutes talking with the patient or their proxy.  CC: New patient visit. ASTHMA  Subjective   Matthew Ellison is a 43 y.o. established patient. Telephone visit today for  HPI  He lives with his mother Errol Ala He has asthma and uses inhalers He uses his inhaler when he needs it He states that he uses [place] which he takes daily He denies recent asthma attack He takes nasonex He also takes singulair He got his flu shot every October 2019  Patient Active Problem List   Diagnosis Date Noted  . Headache 03/13/2015  . Asthma with acute exacerbation 01/30/2015  . Allergic rhinoconjunctivitis 01/30/2015  . ECZEMA 08/12/2008  . Allergic rhinitis 06/18/2007  . 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 Asthma 07/06/2006    Past Medical History:  Diagnosis Date  . Asthma   . Panic attacks     Current Outpatient Medications  Medication Sig Dispense Refill  . albuterol (PROAIR HFA) 108 (90 Base) MCG/ACT inhaler Inhale 2 puffs into the lungs every 4 (four) hours as needed. 1 Inhaler 3  . cetirizine (ZYRTEC) 10 MG tablet Take 1 tablet (10 mg total) by mouth daily as needed  for allergies. 30 tablet 5  . fluticasone furoate-vilanterol (BREO ELLIPTA) 200-25 MCG/INH AEPB Inhale 1 puff into the lungs daily. 28 each 5  . mometasone (NASONEX) 50 MCG/ACT nasal spray Place 2 sprays into the nose daily. 17 g 5  . montelukast (SINGULAIR) 10 MG tablet Take 1 tablet (10 mg total) by mouth at bedtime. 30 tablet 5  . omeprazole (PRILOSEC) 40 MG capsule Take 1 capsule (40 mg total) by mouth daily. 30 capsule 5   No current facility-administered medications for this visit.     No Known Allergies  Social History   Socioeconomic History  . Marital status: Single    Spouse name: Not on file  . Number of children: Not on file  . Years of education: Not on file  . Highest education level: Not on file  Occupational History  . Not on file  Social Needs  . Financial resource strain: Not on file  . Food insecurity:    Worry: Not on file    Inability: Not on file  . Transportation needs:    Medical: Not on file    Non-medical: Not on file  Tobacco Use  . Smoking status: Never Smoker  . Smokeless tobacco: Never Used  Substance and Sexual Activity  . Alcohol use: No  . Drug use: No  . Sexual activity: Not on file  Lifestyle  . Physical activity:    Days per week: Not on file    Minutes per session: Not on file  . Stress: Not on file  Relationships  .  Social connections:    Talks on phone: Not on file    Gets together: Not on file    Attends religious service: Not on file    Active member of club or organization: Not on file    Attends meetings of clubs or organizations: Not on file    Relationship status: Not on file  . Intimate partner violence:    Fear of current or ex partner: Not on file    Emotionally abused: Not on file    Physically abused: Not on file    Forced sexual activity: Not on file  Other Topics Concern  . Not on file  Social History Narrative  . Not on file    ROS Review of Systems  Constitutional: Negative for activity change,  appetite change, chills and fever.  HENT: Negative for congestion, nosebleeds, trouble swallowing and voice change.   Respiratory: Negative for cough, shortness of breath and wheezing.   Gastrointestinal: Negative for diarrhea, nausea and vomiting.  Genitourinary: Negative for difficulty urinating, dysuria, flank pain and hematuria.  Musculoskeletal: Negative for back pain, joint swelling and neck pain.  Neurological: Negative for dizziness, speech difficulty, light-headedness and numbness.  See HPI. All other review of systems negative.   Objective   Vitals as reported by the patient: There were no vitals filed for this visit.  Diagnoses and all orders for this visit:  Mild persistent asthma without complication-  Stable, continue current meds, discussed asthma action plan  Seasonal allergic rhinitis due to pollen- discussed using allergies  Encounter to establish care with new doctor  Other orders -     Discontinue: albuterol (PROAIR HFA) 108 (90 Base) MCG/ACT inhaler; Inhale 2 puffs into the lungs every 4 (four) hours as needed for wheezing or shortness of breath. -     fluticasone furoate-vilanterol (BREO ELLIPTA) 200-25 MCG/INH AEPB; Inhale 1 puff into the lungs daily. -     omeprazole (PRILOSEC) 40 MG capsule; Take 1 capsule (40 mg total) by mouth daily. -     mometasone (NASONEX) 50 MCG/ACT nasal spray; Place 2 sprays into the nose daily. -     montelukast (SINGULAIR) 10 MG tablet; Take 1 tablet (10 mg total) by mouth at bedtime. -     cetirizine (ZYRTEC) 10 MG tablet; Take 1 tablet (10 mg total) by mouth daily as needed for allergies. -     albuterol (PROAIR HFA) 108 (90 Base) MCG/ACT inhaler; Inhale 2 puffs into the lungs every 4 (four) hours as needed.     I discussed the assessment and treatment plan with the patient. The patient was provided an opportunity to ask questions and all were answered. The patient agreed with the plan and demonstrated an understanding of the  instructions.   The patient was advised to call back or seek an in-person evaluation if the symptoms worsen or if the condition fails to improve as anticipated.  I provided 15 minutes of non-face-to-face time during this encounter.  Forrest Moron, MD  Primary Care at [facility]

## 2018-10-03 ENCOUNTER — Encounter: Payer: Self-pay | Admitting: Emergency Medicine

## 2018-10-03 ENCOUNTER — Other Ambulatory Visit: Payer: Self-pay

## 2018-10-03 ENCOUNTER — Ambulatory Visit (INDEPENDENT_AMBULATORY_CARE_PROVIDER_SITE_OTHER): Payer: Medicare Other | Admitting: Emergency Medicine

## 2018-10-03 VITALS — BP 114/64 | HR 68 | Temp 98.4°F | Resp 16 | Ht 64.0 in | Wt 134.0 lb

## 2018-10-03 DIAGNOSIS — L309 Dermatitis, unspecified: Secondary | ICD-10-CM | POA: Diagnosis not present

## 2018-10-03 DIAGNOSIS — L299 Pruritus, unspecified: Secondary | ICD-10-CM | POA: Diagnosis not present

## 2018-10-03 MED ORDER — CETIRIZINE HCL 10 MG PO TABS: 10.0000 mg | ORAL_TABLET | Freq: Every day | ORAL | 11 refills | Status: DC

## 2018-10-03 MED ORDER — TRIAMCINOLONE ACETONIDE 0.1 % EX CREA: 1.0000 "application " | TOPICAL_CREAM | Freq: Two times a day (BID) | CUTANEOUS | 0 refills | Status: DC

## 2018-10-03 MED ORDER — PREDNISONE 20 MG PO TABS: 40.0000 mg | ORAL_TABLET | Freq: Every day | ORAL | 0 refills | Status: AC

## 2018-10-03 NOTE — Progress Notes (Signed)
Called pt set him up appt for follow up on his asthma in Sept  FR

## 2018-10-03 NOTE — Progress Notes (Signed)
Matthew Ellison 43 y.o.   Chief Complaint  Patient presents with   Rash    per patient on arms and below the stomach since last year, comes back on the stomach    HISTORY OF PRESENT ILLNESS: This is a 43 y.o. male complaining of generalized itching on and off for the past year.  Has not seen a dermatologist yet.  Unclear etiology.  HPI   Prior to Admission medications   Medication Sig Start Date End Date Taking? Authorizing Provider  cetirizine (ZYRTEC) 10 MG tablet Take 1 tablet (10 mg total) by mouth daily as needed for allergies. 08/01/18  Yes Stallings, Zoe A, MD  fluticasone furoate-vilanterol (BREO ELLIPTA) 200-25 MCG/INH AEPB Inhale 1 puff into the lungs daily. 08/01/18  Yes Stallings, Zoe A, MD  mometasone (NASONEX) 50 MCG/ACT nasal spray Place 2 sprays into the nose daily. 08/01/18  Yes Stallings, Zoe A, MD  montelukast (SINGULAIR) 10 MG tablet Take 1 tablet (10 mg total) by mouth at bedtime. 08/01/18  Yes Forrest Moron, MD  omeprazole (PRILOSEC) 40 MG capsule Take 1 capsule (40 mg total) by mouth daily. 08/01/18  Yes Stallings, Zoe A, MD  albuterol (PROAIR HFA) 108 (90 Base) MCG/ACT inhaler Inhale 2 puffs into the lungs every 4 (four) hours as needed. 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 No pertinent surgical history.  Social History   Socioeconomic History   Marital status: Single    Spouse name: Not on file   Number of children: Not on file   Years of education: Not on file   Highest education level: Not on file  Occupational History   Not on file  Social Needs   Financial resource strain: Not  on file   Food insecurity:    Worry: Not on file    Inability: Not on file   Transportation needs:    Medical: Not on file    Non-medical: Not on file  Tobacco Use   Smoking status: Never Smoker   Smokeless tobacco: Never Used  Substance and Sexual Activity   Alcohol use: No   Drug use: No   Sexual activity: Not on file  Lifestyle   Physical activity:    Days per week: Not on file    Minutes per session: Not on file   Stress: Not on file  Relationships   Social connections:    Talks on phone: Not on file    Gets together: Not on file    Attends religious service: Not on file    Active member of club or organization: Not on file    Attends meetings of clubs or organizations: Not on file    Relationship status: Not on file   Intimate partner violence:    Fear of current or ex partner: Not on file    Emotionally abused: Not on file    Physically abused: Not on file    Forced sexual activity: Not on file  Other Topics Concern   Not on file  Social History Narrative   Not on file    Family History  Problem Relation  Age of Onset   Allergic rhinitis Neg Hx    Angioedema Neg Hx    Asthma Neg Hx    Atopy Neg Hx    Eczema Neg Hx    Immunodeficiency Neg Hx    Urticaria Neg Hx      Review of Systems  Constitutional: Negative for chills and fever.  Eyes: Negative for discharge and redness.  Respiratory: Negative for cough, hemoptysis and wheezing.   Cardiovascular: Negative for chest pain and palpitations.  Gastrointestinal: Negative.  Negative for abdominal pain, diarrhea, nausea and vomiting.  Genitourinary: Negative.   Musculoskeletal: Negative for myalgias.  Skin: Positive for itching and rash.  Neurological: Negative for dizziness and headaches.  Endo/Heme/Allergies: Negative.   All other systems reviewed and are negative.  Vitals:   10/03/18 1618  BP: 114/64  Pulse: 68  Resp: 16  Temp: 98.4 F (36.9 C)  SpO2: 98%     Physical  Exam Vitals signs reviewed.  Constitutional:      Appearance: Normal appearance.  HENT:     Head: Normocephalic and atraumatic.     Mouth/Throat:     Mouth: Mucous membranes are moist.     Pharynx: Oropharynx is clear.  Neck:     Musculoskeletal: Normal range of motion and neck supple.  Cardiovascular:     Rate and Rhythm: Normal rate.     Heart sounds: Normal heart sounds.  Pulmonary:     Effort: Pulmonary effort is normal.     Breath sounds: Normal breath sounds.  Abdominal:     General: There is no distension.     Palpations: Abdomen is soft.     Tenderness: There is no abdominal tenderness.  Skin:    Capillary Refill: Capillary refill takes less than 2 seconds.     Findings: Rash present.     Comments: Positive eczematous rash to lower abdomen as well as extremities.  Dry rash with no erythema.  No secondary infection.  No vesicular lesions.  Neurological:     General: No focal deficit present.     Mental Status: He is alert and oriented to person, place, and time.  Psychiatric:        Mood and Affect: Mood normal.        Behavior: Behavior normal.      ASSESSMENT & PLAN: [name] was seen today for rash.  Diagnoses and all orders for this visit:  Eczema, unspecified type -     Ambulatory referral to Dermatology -     predniSONE (DELTASONE) 20 MG tablet; Take 2 tablets (40 mg total) by mouth daily with breakfast for 5 days. -     triamcinolone cream (KENALOG) 0.1 %; Apply 1 application topically 2 (two) times daily.  Itching -     cetirizine (ZYRTEC) 10 MG tablet; Take 1 tablet (10 mg total) by mouth daily for 7 days.    Patient Instructions       If you have lab work done today you will be contacted with your lab results within the next 2 weeks.  If you have not heard from [place] then please contact us. The fastest way to get your results is to register for My Chart.   IF you received an x-ray today, you will receive an invoice from [facility].  Please contact [facility] at [phone] with questions or concerns regarding your invoice.   IF you received labwork today, you will receive an invoice from [place]. Please contact [facility] at [phone] with questions or  concerns regarding your invoice.   Our billing staff will not be able to assist you with questions regarding bills from these companies.  You will be contacted with the lab results as soon as they are available. The fastest way to get your results is to activate your My Chart account. Instructions are located on the last page of this paperwork. If you have not heard from [place] regarding the results in 2 weeks, please contact this office.     Eczema Eczema is a broad term for a group of skin conditions that cause skin to become rough and inflamed. Each type of eczema has different triggers, symptoms, and treatments. Eczema of any type is usually itchy and symptoms range from mild to severe. Eczema and its symptoms are not spread from person to person (are not contagious). It can appear on different parts of the body at different times. Your eczema may not look the same as someone else's eczema. What are the types of eczema? Atopic dermatitis This is a long-term (chronic) skin disease that keeps coming back (recurring). Usual symptoms are dry skin and small, solid pimples that may swell and leak fluid (weep). Contact dermatitis  This happens when something irritates the skin and causes a rash. The irritation can come from substances that you are allergic to (allergens), such as poison ivy, chemicals, or medicines that were applied to your skin. Dyshidrotic eczema This is a form of eczema on the hands and feet. It shows up as very itchy, fluid-filled blisters. It can affect people of any age, but is more common before age 38. Hand eczema  This causes very itchy areas of skin on the palms and sides of the hands and fingers. This type of eczema is common in industrial  jobs where you may be exposed to many different types of irritants. Lichen simplex chronicus This type of eczema occurs when a person constantly scratches one area of the body. Repeated scratching of the area leads to thickened skin (lichenification). Lichen simplex chronicus can occur along with other types of eczema. It is more common in adults, but may be seen in children as well. Nummular eczema This is a common type of eczema. It has no known cause. It typically causes a red, circular, crusty lesion (plaque) that may be itchy. Scratching may become a habit and can cause bleeding. Nummular eczema occurs most often in people of middle-age or older. It most often affects the hands. Seborrheic dermatitis This is a common skin disease that mainly affects the scalp. It may also affect any oily areas of the body, such as the face, sides of nose, eyebrows, ears, eyelids, and chest. It is marked by small scaling and redness of the skin (erythema). This can affect people of all ages. In infants, this condition is known as Chartered certified accountant." Stasis dermatitis This is a common skin disease that usually appears on the legs and feet. It most often occurs in people who have a condition that prevents blood from being pumped through the veins in the legs (chronic venous insufficiency). Stasis dermatitis is a chronic condition that needs long-term management. How is eczema diagnosed? Your health care provider will examine your skin and review your medical history. He or she may also give you skin patch tests. These tests involve taking patches that contain possible allergens and placing them on your back. He or she will then check in a few days to see if an allergic reaction occurred. What are the common treatments? Treatment  for eczema is based on the type of eczema you have. Hydrocortisone steroid medicine can relieve itching quickly and help reduce inflammation. This medicine may be prescribed or obtained  over-the-counter, depending on the strength of the medicine that is needed. Follow these instructions at home:  Take over-the-counter and prescription medicines only as told by your health care provider.  Use creams or ointments to moisturize your skin. Do not use lotions.  Learn what triggers or irritates your symptoms. Avoid these things.  Treat symptom flare-ups quickly.  Do not itch your skin. This can make your rash worse.  Keep all follow-up visits as told by your health care provider. This is important. Where to find more information  The American Academy of Dermatology: http://jones-macias.info/  The National Eczema Association: www.nationaleczema.org Contact a health care provider if:  You have serious itching, even with treatment.  You regularly scratch your skin until it bleeds.  Your rash looks different than usual.  Your skin is painful, swollen, or more red than usual.  You have a fever. Summary  There are eight general types of eczema. Each type has different triggers.  Eczema of any type causes itching that may range from mild to severe.  Treatment varies based on the type of eczema you have. Hydrocortisone steroid medicine can help with itching and inflammation.  Protecting your skin is the best way to prevent eczema. Use moisturizers and lotions. Avoid triggers and irritants, and treat flare-ups quickly. This information is not intended to replace advice given to you by your health care provider. Make sure you discuss any questions you have with your health care provider. Document Released: 09/08/2016 Document Revised: 09/08/2016 Document Reviewed: 09/08/2016 Elsevier Interactive Patient Education  2019 Elsevier Inc.      Agustina Caroli, MD [facility]

## 2018-10-03 NOTE — Patient Instructions (Addendum)
If you have lab work done today you will be contacted with your lab results within the next 2 weeks.  If you have not heard from [place] then please contact us. The fastest way to get your results is to register for My Chart.   IF you received an x-ray today, you will receive an invoice from [facility]. Please contact [facility] at [phone] with questions or concerns regarding your invoice.   IF you received labwork today, you will receive an invoice from [place]. Please contact [facility] at [phone] with questions or concerns regarding your invoice.   Our billing staff will not be able to assist you with questions regarding bills from these companies.  You will be contacted with the lab results as soon as they are available. The fastest way to get your results is to activate your My Chart account. Instructions are located on the last page of this paperwork. If you have not heard from [place] regarding the results in 2 weeks, please contact this office.     Eczema Eczema is a broad term for a group of skin conditions that cause skin to become rough and inflamed. Each type of eczema has different triggers, symptoms, and treatments. Eczema of any type is usually itchy and symptoms range from mild to severe. Eczema and its symptoms are not spread from person to person (are not contagious). It can appear on different parts of the body at different times. Your eczema may not look the same as someone else's eczema. What are the types of eczema? Atopic dermatitis This is a long-term (chronic) skin disease that keeps coming back (recurring). Usual symptoms are dry skin and small, solid pimples that may swell and leak fluid (weep). Contact dermatitis  This happens when something irritates the skin and causes a rash. The irritation can come from substances that you are allergic to (allergens), such as poison ivy, chemicals, or medicines that were applied to your  skin. Dyshidrotic eczema This is a form of eczema on the hands and feet. It shows up as very itchy, fluid-filled blisters. It can affect people of any age, but is more common before age 70. Hand eczema  This causes very itchy areas of skin on the palms and sides of the hands and fingers. This type of eczema is common in industrial jobs where you may be exposed to many different types of irritants. Lichen simplex chronicus This type of eczema occurs when a person constantly scratches one area of the body. Repeated scratching of the area leads to thickened skin (lichenification). Lichen simplex chronicus can occur along with other types of eczema. It is more common in adults, but may be seen in children as well. Nummular eczema This is a common type of eczema. It has no known cause. It typically causes a red, circular, crusty lesion (plaque) that may be itchy. Scratching may become a habit and can cause bleeding. Nummular eczema occurs most often in people of middle-age or older. It most often affects the hands. Seborrheic dermatitis This is a common skin disease that mainly affects the scalp. It may also affect any oily areas of the body, such as the face, sides of nose, eyebrows, ears, eyelids, and chest. It is marked by small scaling and redness of the skin (erythema). This can affect people of all ages. In infants, this condition is known as Chartered certified accountant." Stasis dermatitis This is a common skin disease that usually appears on the legs and feet. It  most often occurs in people who have a condition that prevents blood from being pumped through the veins in the legs (chronic venous insufficiency). Stasis dermatitis is a chronic condition that needs long-term management. How is eczema diagnosed? Your health care provider will examine your skin and review your medical history. He or she may also give you skin patch tests. These tests involve taking patches that contain possible allergens and placing them  on your back. He or she will then check in a few days to see if an allergic reaction occurred. What are the common treatments? Treatment for eczema is based on the type of eczema you have. Hydrocortisone steroid medicine can relieve itching quickly and help reduce inflammation. This medicine may be prescribed or obtained over-the-counter, depending on the strength of the medicine that is needed. Follow these instructions at home:  Take over-the-counter and prescription medicines only as told by your health care provider.  Use creams or ointments to moisturize your skin. Do not use lotions.  Learn what triggers or irritates your symptoms. Avoid these things.  Treat symptom flare-ups quickly.  Do not itch your skin. This can make your rash worse.  Keep all follow-up visits as told by your health care provider. This is important. Where to find more information  The American Academy of Dermatology: http://jones-macias.info/  The National Eczema Association: www.nationaleczema.org Contact a health care provider if:  You have serious itching, even with treatment.  You regularly scratch your skin until it bleeds.  Your rash looks different than usual.  Your skin is painful, swollen, or more red than usual.  You have a fever. Summary  There are eight general types of eczema. Each type has different triggers.  Eczema of any type causes itching that may range from mild to severe.  Treatment varies based on the type of eczema you have. Hydrocortisone steroid medicine can help with itching and inflammation.  Protecting your skin is the best way to prevent eczema. Use moisturizers and lotions. Avoid triggers and irritants, and treat flare-ups quickly. This information is not intended to replace advice given to you by your health care provider. Make sure you discuss any questions you have with your health care provider. Document Released: 09/08/2016 Document Revised: 09/08/2016 Document Reviewed:  09/08/2016 Elsevier Interactive Patient Education  2019 [employer].

## 2018-12-11 ENCOUNTER — Ambulatory Visit: Payer: Self-pay | Admitting: Allergy and Immunology

## 2018-12-18 ENCOUNTER — Ambulatory Visit: Payer: Medicare Other | Admitting: Allergy and Immunology

## 2019-01-25 ENCOUNTER — Ambulatory Visit: Payer: Medicare Other | Admitting: Family Medicine

## 2019-01-28 ENCOUNTER — Encounter: Payer: Self-pay | Admitting: Family Medicine

## 2019-02-25 ENCOUNTER — Telehealth: Payer: Self-pay | Admitting: *Deleted

## 2019-02-25 NOTE — Telephone Encounter (Signed)
No answer  Schedule AWV

## 2019-02-26 ENCOUNTER — Other Ambulatory Visit: Payer: Self-pay

## 2019-02-26 ENCOUNTER — Ambulatory Visit (INDEPENDENT_AMBULATORY_CARE_PROVIDER_SITE_OTHER): Payer: Medicare Other | Admitting: Allergy and Immunology

## 2019-02-26 VITALS — BP 106/84 | HR 117 | Temp 98.4°F | Resp 16 | Ht 64.0 in

## 2019-02-26 DIAGNOSIS — J4540 Moderate persistent asthma, uncomplicated: Secondary | ICD-10-CM

## 2019-02-26 DIAGNOSIS — K219 Gastro-esophageal reflux disease without esophagitis: Secondary | ICD-10-CM | POA: Diagnosis not present

## 2019-02-26 DIAGNOSIS — J3089 Other allergic rhinitis: Secondary | ICD-10-CM

## 2019-02-26 MED ORDER — ALBUTEROL SULFATE HFA 108 (90 BASE) MCG/ACT IN AERS: 1.0000 | INHALATION_SPRAY | Freq: Four times a day (QID) | RESPIRATORY_TRACT | 1 refills | Status: DC | PRN

## 2019-02-26 NOTE — Progress Notes (Signed)
- [place] [facility] [place]   Follow-up Note  Referring Provider: Forrest Moron, MD Primary Provider: Forrest Moron, MD Date of Office Visit: 02/26/2019  Subjective:   Matthew Ellison (DOB: Jan 03, 1976) is a 43 y.o. male who returns to the [facility] [place] on 02/26/2019 in re-evaluation of the following:  HPI: Matthew Ellison returns to this clinic in reevaluation of asthma and allergic rhinoconjunctivitis.  His last visit to this clinic was 26 June 2018.  According to Matthew Ellison he has done very well since his last visit without the need for systemic steroid or an antibiotic and rare use of the short acting bronchodilator and very little problems with asthmatic symptoms while consistently using his Breo and montelukast.  Likewise he has had no issues with his nose and he uses a nasal steroid intermittently.  His reflux is under excellent control on his current plan which includes omeprazole every day.  He did receive the flu vaccine this year.  Allergies as of 02/26/2019   No Known Allergies     Medication List      albuterol 108 (90 Base) MCG/ACT inhaler Commonly known as: ProAir HFA Inhale 2 puffs into the lungs every 4 (four) hours as needed.   cetirizine 10 MG tablet Commonly known as: ZYRTEC Take 1 tablet (10 mg total) by mouth daily as needed for allergies.   cetirizine 10 MG tablet Commonly known as: ZYRTEC Take 1 tablet (10 mg total) by mouth daily for 7 days.   fluticasone furoate-vilanterol 200-25 MCG/INH Aepb Commonly known as: Breo Ellipta Inhale 1 puff into the lungs daily.   mometasone 50 MCG/ACT nasal spray Commonly known as: NASONEX Place 2 sprays into the nose daily.   montelukast 10 MG tablet Commonly known as: SINGULAIR Take 1 tablet (10 mg total) by mouth at bedtime.   omeprazole 40 MG capsule Commonly known as: PRILOSEC Take 1 capsule (40 mg total) by mouth daily.   triamcinolone cream  0.1 % Commonly known as: KENALOG Apply 1 application topically 2 (two) times daily.       Past Medical History:  Diagnosis Date  . Asthma   . Panic attacks     No past surgical history on file.  Review of systems negative except as noted in HPI / PMHx or noted below:  Review of Systems  Constitutional: Negative.   HENT: Negative.   Eyes: Negative.   Respiratory: Negative.   Cardiovascular: Negative.   Gastrointestinal: Negative.   Genitourinary: Negative.   Musculoskeletal: Negative.   Skin: Negative.   Neurological: Negative.   Endo/Heme/Allergies: Negative.   Psychiatric/Behavioral: Negative.      Objective:   Vitals:   02/26/19 1509  BP: 106/84  Pulse: (!) 117  Resp: 16  Temp: 98.4 F (36.9 C)  SpO2: 98%   Height: 5\' 4"  (162.6 cm)      Physical Exam Constitutional:      Appearance: He is not diaphoretic.  HENT:     Head: Normocephalic.     Right Ear: Tympanic membrane, ear canal and external ear normal.     Left Ear: Tympanic membrane, ear canal and external ear normal.     Nose: Nose normal. No mucosal edema or rhinorrhea.     Mouth/Throat:     Pharynx: Uvula midline. No oropharyngeal exudate.  Eyes:     Conjunctiva/sclera: Conjunctivae normal.  Neck:     Thyroid: No thyromegaly.     Trachea: Trachea normal. No tracheal tenderness  or tracheal deviation.  Cardiovascular:     Rate and Rhythm: Normal rate and regular rhythm.     Heart sounds: Normal heart sounds, S1 normal and S2 normal. No murmur.  Pulmonary:     Effort: No respiratory distress.     Breath sounds: Normal breath sounds. No stridor. No wheezing or rales.  Lymphadenopathy:     Head:     Right side of head: No tonsillar adenopathy.     Left side of head: No tonsillar adenopathy.     Cervical: No cervical adenopathy.  Skin:    Findings: No erythema or rash.     Nails: There is no clubbing.   Neurological:     Mental Status: He is alert.     Diagnostics:    Spirometry  was performed and demonstrated an FEV1 of 1.87 at 67 % of predicted.  The patient had an Asthma Control Test with the following results: ACT Total Score: 24.    Assessment and Plan:   1. Asthma, moderate persistent, well-controlled   2. Other allergic rhinitis   3. LPRD (laryngopharyngeal reflux disease)     1. Continue Breo 200 - one inhalation 1 time per day.    2. Continue Nasonex one spray each nostril one time per day  3. Continue Montelukast 10 mg one tablet one time per day  4. Continue Omeprazole 40mg  one tablet one time per day  5. If needed Cetirizine 10 mg one tablet one time per day  6. If needed Ventolin HFA 2 puffs every 4-6 hours  7. Return to clinic in 6 months or earlier if problem  8. Obtain Covid vaccine when available  Matthew Ellison really appears to be doing quite well on his current plan and we will keep him on medications directed against respiratory tract inflammation and reflux as noted above and see him back in his clinic in 6 months or earlier if there is a problem.  [name], MD Allergy / Immunology [place]

## 2019-02-26 NOTE — Patient Instructions (Addendum)
  1. Continue Breo 200 - one inhalation 1 time per day.    2. Continue Nasonex one spray each nostril one time per day  3. Continue Montelukast 10 mg one tablet one time per day  4. Continue Omeprazole 40mg  one tablet one time per day  5. If needed Cetirizine 10 mg one tablet one time per day  6. If needed Ventolin HFA 2 puffs every 4-6 hours  7. Return to clinic in 6 months or earlier if problem  8. Obtain Covid vaccine when available

## 2019-02-27 ENCOUNTER — Encounter: Payer: Self-pay | Admitting: Allergy and Immunology

## 2019-03-22 ENCOUNTER — Other Ambulatory Visit: Payer: Self-pay | Admitting: Emergency Medicine

## 2019-03-22 DIAGNOSIS — L309 Dermatitis, unspecified: Secondary | ICD-10-CM

## 2019-07-25 ENCOUNTER — Ambulatory Visit: Payer: Medicare Other | Attending: Internal Medicine

## 2019-07-25 DIAGNOSIS — Z23 Encounter for immunization: Secondary | ICD-10-CM

## 2019-07-25 NOTE — Progress Notes (Signed)
   Covid-19 Vaccination Clinic  Name:  Matthew Ellison    MRN: [id number] DOB: 06-10-1975  07/25/2019  Matthew Ellison was observed post Covid-19 immunization for 15 minutes without incident. He was provided with Vaccine Information Sheet and instruction to access the V-Safe system.   Matthew Ellison was instructed to call 911 with any severe reactions post vaccine: Marland Kitchen Difficulty breathing  . Swelling of face and throat  . A fast heartbeat  . A bad rash all over body  . Dizziness and weakness   Immunizations Administered    Name Date Dose VIS Date Route   Pfizer COVID-19 Vaccine 07/25/2019 10:12 AM 0.3 mL 04/19/2019 Intramuscular   Manufacturer: [place]   Lot: [id number]   [place]: [id number]

## 2019-08-19 ENCOUNTER — Ambulatory Visit: Payer: Medicare Other | Attending: Internal Medicine

## 2019-08-19 DIAGNOSIS — Z23 Encounter for immunization: Secondary | ICD-10-CM

## 2019-08-19 NOTE — Progress Notes (Signed)
   Covid-19 Vaccination Clinic  Name:  Matthew Ellison    MRN: [id number] DOB: 15-Dec-1975  08/19/2019  Mr. Moeder was observed post Covid-19 immunization for 15 minutes without incident. He was provided with Vaccine Information Sheet and instruction to access the V-Safe system.   Mr. Minser was instructed to call 911 with any severe reactions post vaccine: Marland Kitchen Difficulty breathing  . Swelling of face and throat  . A fast heartbeat  . A bad rash all over body  . Dizziness and weakness   Immunizations Administered    Name Date Dose VIS Date Route   Pfizer COVID-19 Vaccine 08/19/2019 10:01 AM 0.3 mL 04/19/2019 Intramuscular   Manufacturer: [place]   Lot: [id number]   [place]: [id number]

## 2019-08-27 ENCOUNTER — Other Ambulatory Visit: Payer: Self-pay

## 2019-08-27 ENCOUNTER — Ambulatory Visit (INDEPENDENT_AMBULATORY_CARE_PROVIDER_SITE_OTHER): Payer: Medicare Other | Admitting: Allergy and Immunology

## 2019-08-27 ENCOUNTER — Encounter: Payer: Self-pay | Admitting: Allergy and Immunology

## 2019-08-27 VITALS — BP 110/72 | HR 82 | Temp 98.0°F | Resp 16

## 2019-08-27 DIAGNOSIS — J4540 Moderate persistent asthma, uncomplicated: Secondary | ICD-10-CM | POA: Diagnosis not present

## 2019-08-27 DIAGNOSIS — J3089 Other allergic rhinitis: Secondary | ICD-10-CM | POA: Diagnosis not present

## 2019-08-27 DIAGNOSIS — K219 Gastro-esophageal reflux disease without esophagitis: Secondary | ICD-10-CM | POA: Diagnosis not present

## 2019-08-27 NOTE — Patient Instructions (Addendum)
  1. Continue Breo 200 - one inhalation 1 time per day.    2. Continue Nasonex one spray each nostril one time per day  3. Continue Montelukast 10 mg one tablet one time per day  4. Continue Omeprazole 40mg  one tablet one time per day  5. If needed Cetirizine 10 mg one tablet one time per day  6. If needed Ventolin HFA 2 puffs every 4-6 hours  7. Return to clinic in 12 months or earlier if problem

## 2019-08-27 NOTE — Progress Notes (Signed)
[place] - [place] [facility] [place]   Follow-up Note  Referring Provider: Forrest Moron, MD Primary Provider: Forrest Moron, MD Date of Office Visit: 08/27/2019  Subjective:   Matthew Ellison (DOB: 22-Jun-1975) is a 44 y.o. male who returns to the [facility] and [place] on 08/27/2019 in re-evaluation of the following:  HPI: Matthew Ellison returns to this clinic in reevaluation of asthma and allergic rhinoconjunctivitis.  His last visit to this clinic was 26 February 2019.  Matthew Ellison has done very well since his last visit without the need for systemic steroid or antibiotic for any type of airway issue and rare use of a short acting bronchodilator.  He believes that his nose is doing very well at this point in time although since the pollen has arrived he has had a little bit more sneezing.  He believes that his reflux is under very good control.  He has received 2 Pfizer Covid vaccinations.  Allergies as of 08/27/2019   No Known Allergies     Medication List      albuterol 108 (90 Base) MCG/ACT inhaler Commonly known as: Ventolin HFA Inhale 1-2 puffs into the lungs every 6 (six) hours as needed for wheezing or shortness of breath.   cetirizine 10 MG tablet Commonly known as: ZYRTEC Take 1 tablet (10 mg total) by mouth daily as needed for allergies.   cetirizine 10 MG tablet Commonly known as: ZYRTEC Take 1 tablet (10 mg total) by mouth daily for 7 days.   fluticasone furoate-vilanterol 200-25 MCG/INH Aepb Commonly known as: Breo Ellipta Inhale 1 puff into the lungs daily.   mometasone 50 MCG/ACT nasal spray Commonly known as: NASONEX Place 2 sprays into the nose daily.   montelukast 10 MG tablet Commonly known as: SINGULAIR Take 1 tablet (10 mg total) by mouth at bedtime.   omeprazole 40 MG capsule Commonly known as: PRILOSEC Take 1 capsule (40 mg total) by mouth daily.   triamcinolone cream 0.1 % Commonly known as: KENALOG  APPLY TO AFFECTED AREA TWICE A DAY       Past Medical History:  Diagnosis Date  . Asthma   . Panic attacks     History reviewed. No pertinent surgical history.  Review of systems negative except as noted in HPI / PMHx or noted below:  Review of Systems  Constitutional: Negative.   HENT: Negative.   Eyes: Negative.   Respiratory: Negative.   Cardiovascular: Negative.   Gastrointestinal: Negative.   Genitourinary: Negative.   Musculoskeletal: Negative.   Skin: Negative.   Neurological: Negative.   Endo/Heme/Allergies: Negative.   Psychiatric/Behavioral: Negative.      Objective:   Vitals:   08/27/19 1552  BP: 110/72  Pulse: 82  Resp: 16  Temp: 98 F (36.7 C)  SpO2: 99%          Physical Exam Constitutional:      Appearance: He is not diaphoretic.  HENT:     Head: Normocephalic.     Right Ear: Tympanic membrane, ear canal and external ear normal.     Left Ear: Tympanic membrane, ear canal and external ear normal.     Nose: Nose normal. No mucosal edema or rhinorrhea.     Mouth/Throat:     Pharynx: Uvula midline. No oropharyngeal exudate.  Eyes:     Conjunctiva/sclera: Conjunctivae normal.  Neck:     Thyroid: No thyromegaly.     Trachea: Trachea normal. No tracheal tenderness or tracheal deviation.  Cardiovascular:  Rate and Rhythm: Normal rate and regular rhythm.     Heart sounds: Normal heart sounds, S1 normal and S2 normal. No murmur.  Pulmonary:     Effort: No respiratory distress.     Breath sounds: Normal breath sounds. No stridor. No wheezing or rales.  Lymphadenopathy:     Head:     Right side of head: No tonsillar adenopathy.     Left side of head: No tonsillar adenopathy.     Cervical: No cervical adenopathy.  Skin:    Findings: No erythema or rash.     Nails: There is no clubbing.  Neurological:     Mental Status: He is alert.     Diagnostics:    Spirometry was performed and demonstrated an FEV1 of 1.24 at 44 % of predicted.   The patient had an Asthma Control Test with the following results: ACT Total Score: 24.    Assessment and Plan:   1. Asthma, moderate persistent, well-controlled   2. Other allergic rhinitis   3. LPRD (laryngopharyngeal reflux disease)     1. Continue Breo 200 - one inhalation 1 time per day.    2. Continue Nasonex one spray each nostril one time per day  3. Continue Montelukast 10 mg one tablet one time per day  4. Continue Omeprazole 40mg  one tablet one time per day  5. If needed Cetirizine 10 mg one tablet one time per day  6. If needed Ventolin HFA 2 puffs every 4-6 hours  7. Return to clinic in 12 months or earlier if problem  Matthew Ellison is really doing very well regarding all of his issues and at this point he will continue to consistently use anti-inflammatory agents for both his upper and lower airway and therapy directed against reflux.  I will see him back in his clinic in 12 months or earlier if there is a problem.  Matthew Katz, MD Allergy / Immunology [place]

## 2019-08-28 ENCOUNTER — Encounter: Payer: Self-pay | Admitting: Allergy and Immunology

## 2019-09-15 ENCOUNTER — Other Ambulatory Visit: Payer: Self-pay | Admitting: Family Medicine

## 2019-09-15 DIAGNOSIS — R0902 Hypoxemia: Secondary | ICD-10-CM | POA: Diagnosis not present

## 2019-09-15 MED ORDER — ALBUTEROL SULFATE HFA 108 (90 BASE) MCG/ACT IN AERS: 1.0000 | INHALATION_SPRAY | Freq: Four times a day (QID) | RESPIRATORY_TRACT | 1 refills | Status: DC | PRN

## 2019-09-15 MED ORDER — BREO ELLIPTA 200-25 MCG/INH IN AEPB: 1.0000 | INHALATION_SPRAY | Freq: Every day | RESPIRATORY_TRACT | 5 refills | Status: DC

## 2019-09-15 NOTE — Telephone Encounter (Signed)
Requested Prescriptions  Pending Prescriptions Disp Refills  . mometasone (NASONEX) 50 MCG/ACT nasal spray [Pharmacy Med Name: MOMETASONE FUROATE 50 MCG SPRY] 17 g 1    Sig: USE 2 SPRAYS INTO THE NOSE DAILY     Ear, Nose, and Throat: Nasal Preparations - Corticosteroids Passed - 09/15/2019  3:59 PM      Passed - Valid encounter within last 12 months    Recent Outpatient Visits          11 months ago Eczema, unspecified type   Primary Care at [facility], Ines Bloomer, MD   1 year ago Mild persistent asthma without complication   Primary Care at [facility], MD      Future Appointments            In 11 months Kozlow, Donnamarie Poag, MD Allergy and [place]           . PROAIR HFA 108 (90 Base) MCG/ACT inhaler [Pharmacy Med Name: PROAIR HFA 90 MCG INHALER]  3    Sig: INHALE 2 PUFFS INTO THE LUNGS EVERY 4 HOURS AS NEEDED     Pulmonology:  Beta Agonists Failed - 09/15/2019  3:59 PM      Failed - One inhaler should last at least one month. If the patient is requesting refills earlier, contact the patient to check for uncontrolled symptoms.      Passed - Valid encounter within last 12 months    Recent Outpatient Visits          11 months ago Eczema, unspecified type   Primary Care at [facility], [place], MD   1 year ago Mild persistent asthma without complication   Primary Care at [facility], Arlie Solomons, MD      Future Appointments            In 11 months Kozlow, Donnamarie Poag, MD Allergy and [place]

## 2019-09-15 NOTE — Progress Notes (Signed)
Patient's aunt called and reported that Matthew Ellison had an asthma attack because we had not called in his medications during his visit with Dr. Neldon Mc. He called EMS and received some albuterol in the field but was not taken into the ED. Confirmed his pharmacy and I told his aunt that I would send in his Breo and albuterol.  Salvatore Marvel, MD [facility] [place]

## 2019-09-15 NOTE — Addendum Note (Signed)
Addended by: Valentina Shaggy on: 09/15/2019 08:26 PM   Modules accepted: Orders

## 2019-10-09 ENCOUNTER — Other Ambulatory Visit: Payer: Self-pay

## 2019-10-09 DIAGNOSIS — J301 Allergic rhinitis due to pollen: Secondary | ICD-10-CM

## 2019-10-09 MED ORDER — MOMETASONE FUROATE 50 MCG/ACT NA SUSP: NASAL | 1 refills | Status: AC

## 2019-10-09 NOTE — Telephone Encounter (Signed)
Prescription fax received for mometasone furoate 50 mcg spry.

## 2020-03-22 ENCOUNTER — Other Ambulatory Visit: Payer: Self-pay | Admitting: Allergy & Immunology

## 2020-05-15 ENCOUNTER — Other Ambulatory Visit: Payer: Self-pay | Admitting: Allergy & Immunology

## 2020-07-28 ENCOUNTER — Other Ambulatory Visit: Payer: Self-pay | Admitting: Allergy & Immunology

## 2020-09-01 ENCOUNTER — Encounter: Payer: Self-pay | Admitting: Allergy and Immunology

## 2020-09-01 ENCOUNTER — Other Ambulatory Visit: Payer: Self-pay

## 2020-09-01 ENCOUNTER — Ambulatory Visit (INDEPENDENT_AMBULATORY_CARE_PROVIDER_SITE_OTHER): Payer: Medicare Other | Admitting: Allergy and Immunology

## 2020-09-01 VITALS — BP 110/70 | HR 65 | Temp 97.6°F | Resp 16 | Ht 63.25 in | Wt 147.6 lb

## 2020-09-01 DIAGNOSIS — J4540 Moderate persistent asthma, uncomplicated: Secondary | ICD-10-CM | POA: Diagnosis not present

## 2020-09-01 DIAGNOSIS — L309 Dermatitis, unspecified: Secondary | ICD-10-CM

## 2020-09-01 DIAGNOSIS — J3089 Other allergic rhinitis: Secondary | ICD-10-CM | POA: Diagnosis not present

## 2020-09-01 DIAGNOSIS — K219 Gastro-esophageal reflux disease without esophagitis: Secondary | ICD-10-CM

## 2020-09-01 MED ORDER — TRIAMCINOLONE ACETONIDE 0.1 % EX CREA: TOPICAL_CREAM | CUTANEOUS | 3 refills | Status: DC

## 2020-09-01 MED ORDER — BREO ELLIPTA 200-25 MCG/INH IN AEPB: INHALATION_SPRAY | RESPIRATORY_TRACT | 5 refills | Status: AC

## 2020-09-01 MED ORDER — ALBUTEROL SULFATE HFA 108 (90 BASE) MCG/ACT IN AERS: 1.0000 | INHALATION_SPRAY | Freq: Four times a day (QID) | RESPIRATORY_TRACT | 1 refills | Status: DC | PRN

## 2020-09-01 NOTE — Progress Notes (Signed)
[place] - [place] [facility] [place]   Follow-up Note  Referring Provider: Forrest Moron, MD Primary Provider: Forrest Moron, MD Date of Office Visit: 09/01/2020  Subjective:   Matthew Ellison (DOB: October 27, 1975) is a 45 y.o. male who returns to the [facility] and [place] on 09/01/2020 in re-evaluation of the following:  HPI: Matthew Ellison returns to this clinic in evaluation of asthma and allergic rhinoconjunctivitis.  His last visit to this clinic was 27 August 2019.  He has really done well since his last visit and it does not sound as though he has required a systemic steroid or an antibiotic for any type of airway issue and rarely uses a short acting bronchodilator and is able to exert himself without any problem while he consistently has been using a combination inhaler and a nasal steroid and a leukotriene modifier  As well, his reflux is under very good control while using omeprazole.  He has a history of eczema for which he occasionally uses topical triamcinolone.  He has received 3 [place] vaccines.  Allergies as of 09/01/2020   No Known Allergies     Medication List      albuterol 108 (90 Base) MCG/ACT inhaler Commonly known as: VENTOLIN HFA Inhale 1-2 puffs into the lungs every 6 (six) hours as needed for wheezing or shortness of breath.   Breo Ellipta 200-25 MCG/INH Aepb Generic drug: fluticasone furoate-vilanterol TAKE 1 PUFF BY MOUTH EVERY DAY   cetirizine 10 MG tablet Commonly known as: ZYRTEC Take 1 tablet (10 mg total) by mouth daily as needed for allergies.   cetirizine 10 MG tablet Commonly known as: ZYRTEC Take 1 tablet (10 mg total) by mouth daily for 7 days.   mometasone 50 MCG/ACT nasal spray Commonly known as: NASONEX USE 2 SPRAYS INTO THE NOSE DAILY   montelukast 10 MG tablet Commonly known as: SINGULAIR Take 1 tablet (10 mg total) by mouth at bedtime.   omeprazole 40 MG capsule Commonly known as:  PRILOSEC Take 1 capsule (40 mg total) by mouth daily.   triamcinolone cream 0.1 % Commonly known as: KENALOG APPLY TO AFFECTED AREA TWICE A DAY       Past Medical History:  Diagnosis Date  . Asthma   . Panic attacks     History reviewed. No pertinent surgical history.  Review of systems negative except as noted in HPI / PMHx or noted below:  Review of Systems  Constitutional: Negative.   HENT: Negative.   Eyes: Negative.   Respiratory: Negative.   Cardiovascular: Negative.   Gastrointestinal: Negative.   Genitourinary: Negative.   Musculoskeletal: Negative.   Skin: Negative.   Neurological: Negative.   Endo/Heme/Allergies: Negative.   Psychiatric/Behavioral: Negative.      Objective:   Vitals:   09/01/20 1508  BP: 110/70  Pulse: 65  Resp: 16  Temp: 97.6 F (36.4 C)  SpO2: 100%   Height: 5' 3.25" (160.7 cm)  Weight: 147 lb 9.6 oz (67 kg)   Physical Exam Constitutional:      Appearance: He is not diaphoretic.  HENT:     Head: Normocephalic.     Right Ear: Tympanic membrane, ear canal and external ear normal.     Left Ear: Tympanic membrane, ear canal and external ear normal.     Nose: Nose normal. No mucosal edema or rhinorrhea.     Mouth/Throat:     Pharynx: Uvula midline. No oropharyngeal exudate.  Eyes:  Conjunctiva/sclera: Conjunctivae normal.  Neck:     Thyroid: No thyromegaly.     Trachea: Trachea normal. No tracheal tenderness or tracheal deviation.  Cardiovascular:     Rate and Rhythm: Normal rate and regular rhythm.     Heart sounds: Normal heart sounds, S1 normal and S2 normal. No murmur heard.   Pulmonary:     Effort: No respiratory distress.     Breath sounds: Normal breath sounds. No stridor. No wheezing or rales.  Lymphadenopathy:     Head:     Right side of head: No tonsillar adenopathy.     Left side of head: No tonsillar adenopathy.     Cervical: No cervical adenopathy.  Skin:    Findings: No erythema or rash.      Nails: There is no clubbing.  Neurological:     Mental Status: He is alert.     Diagnostics:    Spirometry was performed and demonstrated an FEV1 of 2.05 at 77 % of predicted.   Assessment and Plan:   1. Asthma, moderate persistent, well-controlled   2. Other allergic rhinitis   3. LPRD (laryngopharyngeal reflux disease)   4. Eczema, unspecified type     1. Continue Breo 200 - one inhalation 1 time per day.    2. Continue Nasonex one spray each nostril one time per day  3. Continue Montelukast 10 mg one tablet one time per day  4. Continue Omeprazole 40mg  one tablet one time per day  5. If needed Cetirizine 10 mg one tablet one time per day  6. If needed Ventolin HFA 2 puffs every 4-6 hours  7. If needed triamcinolone 0.1% cream 1 time per day  8. Return to clinic in 12 months or earlier if problem  Matthew Ellison appears to be doing relatively well regarding his multiorgan atopic disease on his plan and we have refilled all of his medications.  He appears to have a very good understanding of his medications and how they work and appropriate dosing of his medications.  Assuming he does well with this plan I will see him back in his clinic in 12 months or earlier if there is a problem.  Matthew Katz, MD Allergy / Immunology [place]

## 2020-09-01 NOTE — Patient Instructions (Addendum)
  1. Continue Breo 200 - one inhalation 1 time per day.    2. Continue Nasonex one spray each nostril one time per day  3. Continue Montelukast 10 mg one tablet one time per day  4. Continue Omeprazole 40mg  one tablet one time per day  5. If needed Cetirizine 10 mg one tablet one time per day  6. If needed Ventolin HFA 2 puffs every 4-6 hours  7. If needed triamcinolone 0.1% cream 1 time per day  8. Return to clinic in 12 months or earlier if problem

## 2020-09-02 ENCOUNTER — Encounter: Payer: Self-pay | Admitting: Allergy and Immunology

## 2021-01-26 ENCOUNTER — Other Ambulatory Visit: Payer: Self-pay | Admitting: Emergency Medicine

## 2021-01-26 DIAGNOSIS — J301 Allergic rhinitis due to pollen: Secondary | ICD-10-CM

## 2021-02-26 ENCOUNTER — Other Ambulatory Visit: Payer: Self-pay | Admitting: Allergy & Immunology

## 2021-03-16 ENCOUNTER — Telehealth: Payer: Self-pay

## 2021-03-16 MED ORDER — CETIRIZINE HCL 10 MG PO TABS: 10.0000 mg | ORAL_TABLET | Freq: Every day | ORAL | 4 refills | Status: DC | PRN

## 2021-03-16 NOTE — Telephone Encounter (Signed)
Called and left a voicemail asking for a return call to inform that refills were sent in.

## 2021-03-16 NOTE — Telephone Encounter (Signed)
Called however was unable to leave a message as phone just kept ringing. Will try again later.

## 2021-03-16 NOTE — Telephone Encounter (Signed)
Patient called with the assistance of a family member to request a refill on Zyrtec. Patients refill has expired at the [facility].   Please Advise

## 2021-03-17 NOTE — Telephone Encounter (Signed)
Called and left a voicemail asking for patient to return call to inform.  

## 2021-04-15 ENCOUNTER — Other Ambulatory Visit: Payer: Self-pay | Admitting: Allergy and Immunology

## 2021-06-07 ENCOUNTER — Other Ambulatory Visit: Payer: Self-pay | Admitting: Allergy and Immunology

## 2022-01-05 ENCOUNTER — Ambulatory Visit: Payer: Medicare Other | Admitting: Physician Assistant

## 2022-01-05 ENCOUNTER — Ambulatory Visit: Payer: Self-pay

## 2022-01-05 NOTE — Telephone Encounter (Signed)
  Chief Complaint: Headache Symptoms: HA 9/10, sinus pressure in between eyes, eye pain  Frequency: several months  Pertinent Negatives: NA Disposition: '[]'$ ED /'[]'$ Urgent Care (no appt availability in office) / '[x]'$ Appointment(In office/virtual)/ '[]'$  [facility]/ '[]'$ Home Care/ '[]'$ Refused Recommended Disposition /'[]'$ [facility]/ '[]'$  Follow-up with PCP Additional Notes: pt feels like this is a sinus HA but hasnt taken any sinus medicine OTC. Doesn't see PCP until 01/29/22. Scheduled appt at [facility] with Cari today at 1500. Pt was provided with address.   Summary: Possible Sinus headache   Pt called reporting that he believes he has had a sinus headache for a few months, please advise  Best contact: [phone]      Reason for Disposition  Headache is a chronic symptom (recurrent or ongoing AND present > 4 weeks)  Answer Assessment - Initial Assessment Questions 1. LOCATION: "Where does it hurt?"      All around head and sinus pressure in between eyes  2. ONSET: "When did the headache start?" (Minutes, hours or days)      Several months  3. PATTERN: "Does the pain come and go, or has it been constant since it started?"     Comes and goes almost every day  4. SEVERITY: "How bad is the pain?" and "What does it keep you from doing?"  (e.g., Scale 1-10; mild, moderate, or severe)   - MILD (1-3): doesn't interfere with normal activities    - MODERATE (4-7): interferes with normal activities or awakens from sleep    - SEVERE (8-10): excruciating pain, unable to do any normal activities        9/10 9. OTHER SYMPTOMS: "Do you have any other symptoms?" (fever, stiff neck, eye pain, sore throat, cold symptoms)     Eye pain  Protocols used: Headache-A-AH

## 2022-01-31 ENCOUNTER — Other Ambulatory Visit: Payer: Self-pay | Admitting: Internal Medicine

## 2022-02-01 LAB — CBC
HCT: 49.6 % (ref 38.5–50.0)
Hemoglobin: 16.2 g/dL (ref 13.2–17.1)
MCH: 29.9 pg (ref 27.0–33.0)
MCHC: 32.7 g/dL (ref 32.0–36.0)
MCV: 91.5 fL (ref 80.0–100.0)
MPV: 10.5 fL (ref 7.5–12.5)
Platelets: 160 10*3/uL (ref 140–400)
RBC: 5.42 10*6/uL (ref 4.20–5.80)
RDW: 13.3 % (ref 11.0–15.0)
WBC: 5.2 10*3/uL (ref 3.8–10.8)

## 2022-02-01 LAB — LIPID PANEL
Cholesterol: 198 mg/dL (ref <200)
HDL: 52 mg/dL (ref >=40)
LDL Cholesterol (Calc): 131 mg/dL (calc) — ABNORMAL HIGH
Non-HDL Cholesterol (Calc): 146 mg/dL (calc) — ABNORMAL HIGH (ref <130)
Total CHOL/HDL Ratio: 3.8 (calc) (ref <5.0)
Triglycerides: 63 mg/dL (ref <150)

## 2022-02-01 LAB — COMPLETE METABOLIC PANEL WITH GFR
AG Ratio: 1.7 (calc) (ref 1.0–2.5)
ALT: 22 U/L (ref 9–46)
AST: 16 U/L (ref 10–40)
Albumin: 4.4 g/dL (ref 3.6–5.1)
Alkaline phosphatase (APISO): 88 U/L (ref 36–130)
BUN: 15 mg/dL (ref 7–25)
CO2: 21 mmol/L (ref 20–32)
Calcium: 9 mg/dL (ref 8.6–10.3)
Chloride: 106 mmol/L (ref 98–110)
Creat: 1.25 mg/dL (ref 0.60–1.29)
Globulin: 2.6 g/dL (calc) (ref 1.9–3.7)
Glucose, Bld: 93 mg/dL (ref 65–99)
Potassium: 4.1 mmol/L (ref 3.5–5.3)
Sodium: 139 mmol/L (ref 135–146)
Total Bilirubin: 0.6 mg/dL (ref 0.2–1.2)
Total Protein: 7 g/dL (ref 6.1–8.1)
eGFR: 71 mL/min/{1.73_m2} (ref >=60)

## 2022-02-01 LAB — TSH: TSH: 2.93 mIU/L (ref 0.40–4.50)

## 2022-02-01 LAB — PSA: PSA: 0.66 ng/mL (ref <=4.00)

## 2022-02-01 LAB — VITAMIN D 25 HYDROXY (VIT D DEFICIENCY, FRACTURES): Vit D, 25-Hydroxy: 14 ng/mL — ABNORMAL LOW (ref 30–100)

## 2022-02-03 ENCOUNTER — Encounter: Payer: Self-pay | Admitting: Internal Medicine

## 2022-02-17 ENCOUNTER — Ambulatory Visit (AMBULATORY_SURGERY_CENTER): Payer: Self-pay

## 2022-02-17 VITALS — Ht 63.0 in | Wt 158.0 lb

## 2022-02-17 DIAGNOSIS — Z1211 Encounter for screening for malignant neoplasm of colon: Secondary | ICD-10-CM

## 2022-02-17 MED ORDER — NA SULFATE-K SULFATE-MG SULF 17.5-3.13-1.6 GM/177ML PO SOLN: 1.0000 | Freq: Once | ORAL | 0 refills | Status: AC

## 2022-02-17 NOTE — Progress Notes (Signed)
No egg or soy allergy known to patient  No issues known to pt with past sedation with any surgeries or procedures Patient denies ever being told they had issues or difficulty with intubation  No FH of Malignant Hyperthermia Pt is not on diet pills Pt is not on  home 02  Pt is not on blood thinners  Pt denies issues with constipation  No A fib or A flutter Have any cardiac testing pending--no Pt instructed to use [email or web address].com or GoodRx for a price reduction on prep   

## 2022-02-28 ENCOUNTER — Ambulatory Visit (AMBULATORY_SURGERY_CENTER): Payer: Medicare Other | Admitting: Internal Medicine

## 2022-02-28 ENCOUNTER — Encounter: Payer: Self-pay | Admitting: Internal Medicine

## 2022-02-28 VITALS — BP 113/78 | HR 95 | Temp 99.3°F | Resp 11 | Ht 63.25 in | Wt 158.0 lb

## 2022-02-28 DIAGNOSIS — D122 Benign neoplasm of ascending colon: Secondary | ICD-10-CM | POA: Diagnosis not present

## 2022-02-28 DIAGNOSIS — Z1211 Encounter for screening for malignant neoplasm of colon: Secondary | ICD-10-CM | POA: Diagnosis not present

## 2022-02-28 DIAGNOSIS — D120 Benign neoplasm of cecum: Secondary | ICD-10-CM

## 2022-02-28 DIAGNOSIS — D125 Benign neoplasm of sigmoid colon: Secondary | ICD-10-CM

## 2022-02-28 HISTORY — PX: COLONOSCOPY: SHX174

## 2022-02-28 MED ORDER — SODIUM CHLORIDE 0.9 % IV SOLN: 500.0000 mL | Freq: Once | INTRAVENOUS | Status: DC

## 2022-02-28 NOTE — Progress Notes (Signed)
GASTROENTEROLOGY PROCEDURE H&P NOTE   Primary Care Physician: Nolene Ebbs, MD    Reason for Procedure:   Colon cancer screening  Plan:    Colonoscopy  Patient is appropriate for endoscopic procedure(s) in the ambulatory ([facility]) setting.  The nature of the procedure, as well as the risks, benefits, and alternatives were carefully and thoroughly reviewed with the patient. Ample time for discussion and questions allowed. The patient understood, was satisfied, and agreed to proceed.     HPI: Matthew Ellison is a 46 y.o. male who presents for colonoscopy for colon cancer screening. Denies blood in stools, changes in bowel habits, weight loss. Denies family history of colon cancer.  Past Medical History:  Diagnosis Date   Asthma    Cognitive impairment    GERD (gastroesophageal reflux disease)    Panic attacks     Past Surgical History:  Procedure Laterality Date   COLONOSCOPY  02/28/2022    Prior to Admission medications   Medication Sig Start Date End Date Taking? Authorizing Provider  albuterol (VENTOLIN HFA) 108 (90 Base) MCG/ACT inhaler INHALE 1-2 PUFFS BY MOUTH EVERY 6 HOURS AS NEEDED FOR WHEEZE OR SHORTNESS OF BREATH 03/01/21  Yes Kozlow, Donnamarie Poag, MD  cetirizine (ZYRTEC) 10 MG tablet TAKE 1 TABLET BY MOUTH EVERY DAY AS NEEDED FOR ALLERGY 06/07/21  Yes Kozlow, Donnamarie Poag, MD  fluticasone furoate-vilanterol (BREO ELLIPTA) 200-25 MCG/INH AEPB TAKE 1 PUFF BY MOUTH EVERY DAY 09/01/20  Yes Kozlow, Donnamarie Poag, MD  mometasone (NASONEX) 50 MCG/ACT nasal spray USE 2 SPRAYS INTO THE NOSE DAILY 10/09/19  Yes Sagardia, Ines Bloomer, MD  montelukast (SINGULAIR) 10 MG tablet Take 1 tablet (10 mg total) by mouth at bedtime. 08/01/18  Yes Forrest Moron, MD  omeprazole (PRILOSEC) 40 MG capsule Take 1 capsule (40 mg total) by mouth daily. 08/01/18  Yes Stallings, Zoe A, MD  triamcinolone cream (KENALOG) 0.1 % APPLY TO AFFECTED AREA TWICE A DAY 09/01/20  Yes Kozlow, Donnamarie Poag, MD  Vitamin D,  Ergocalciferol, (DRISDOL) 1.25 MG (50000 UNIT) CAPS capsule Take 50,000 Units by mouth once a week. 02/01/22  Yes [provider]  hydrocortisone (ANUSOL-HC) 25 MG suppository Place 25 mg rectally 2 (two) times daily. 01/31/22   [provider]    Current Outpatient Medications  Medication Sig Dispense Refill   albuterol (VENTOLIN HFA) 108 (90 Base) MCG/ACT inhaler INHALE 1-2 PUFFS BY MOUTH EVERY 6 HOURS AS NEEDED FOR WHEEZE OR SHORTNESS OF BREATH 18 g 0   cetirizine (ZYRTEC) 10 MG tablet TAKE 1 TABLET BY MOUTH EVERY DAY AS NEEDED FOR ALLERGY 90 tablet 0   fluticasone furoate-vilanterol (BREO ELLIPTA) 200-25 MCG/INH AEPB TAKE 1 PUFF BY MOUTH EVERY DAY 60 each 5   mometasone (NASONEX) 50 MCG/ACT nasal spray USE 2 SPRAYS INTO THE NOSE DAILY 17 g 1   montelukast (SINGULAIR) 10 MG tablet Take 1 tablet (10 mg total) by mouth at bedtime. 30 tablet 5   omeprazole (PRILOSEC) 40 MG capsule Take 1 capsule (40 mg total) by mouth daily. 30 capsule 5   triamcinolone cream (KENALOG) 0.1 % APPLY TO AFFECTED AREA TWICE A DAY 453.6 g 3   Vitamin D, Ergocalciferol, (DRISDOL) 1.25 MG (50000 UNIT) CAPS capsule Take 50,000 Units by mouth once a week.     hydrocortisone (ANUSOL-HC) 25 MG suppository Place 25 mg rectally 2 (two) times daily.     Current Facility-Administered Medications  Medication Dose Route Frequency Provider Last Rate Last Admin   0.9 %  sodium chloride infusion  500 mL Intravenous Once Sharyn Creamer, MD        Allergies as of 02/28/2022 - Review Complete 02/28/2022  Allergen Reaction Noted   Shrimp (diagnostic) Itching and Swelling 02/17/2022    Family History  Problem Relation Age of Onset   Allergic rhinitis Neg Hx    Angioedema Neg Hx    Asthma Neg Hx    Atopy Neg Hx    Eczema Neg Hx    Immunodeficiency Neg Hx    Urticaria Neg Hx    Colon polyps Neg Hx    Colon cancer Neg Hx    Esophageal cancer Neg Hx     Social History   Socioeconomic History   Marital  status: Single    Spouse name: Not on file   Number of children: Not on file   Years of education: Not on file   Highest education level: Not on file  Occupational History   Not on file  Tobacco Use   Smoking status: Never   Smokeless tobacco: Never  Vaping Use   Vaping Use: Never used  Substance and Sexual Activity   Alcohol use: No   Drug use: No   Sexual activity: Not on file  Other Topics Concern   Not on file  Social History Narrative   Not on file   Social Determinants of Health   Financial Resource Strain: Not on file  Food Insecurity: Not on file  Transportation Needs: Not on file  Physical Activity: Not on file  Stress: Not on file  Social Connections: Not on file  Intimate Partner Violence: Not on file    Physical Exam: Vital signs in last 24 hours: BP 124/78   Pulse 98   Temp 99.3 F (37.4 C) (Temporal)   Ht 5' 3.25" (1.607 m)   Wt 158 lb (71.7 kg)   SpO2 99%   BMI 27.77 kg/m  GEN: NAD EYE: Sclerae anicteric ENT: MMM CV: Non-tachycardic Pulm: No increased work of breathing GI: Soft, NT/ND NEURO:  Alert & Oriented   Christia Reading, MD [facility]  02/28/2022 9:55 AM

## 2022-02-28 NOTE — Progress Notes (Signed)
Called to room to assist during endoscopic procedure.  Patient ID and intended procedure confirmed with present staff. Received instructions for my participation in the procedure from the performing physician.  

## 2022-02-28 NOTE — Patient Instructions (Signed)
Discharge instructions given. Handouts on polyps and hemorrhoids. Resume previous medications. YOU HAD AN ENDOSCOPIC PROCEDURE TODAY AT THE [facility]:   Refer to the procedure report that was given to you for any specific questions about what was found during the examination.  If the procedure report does not answer your questions, please call your gastroenterologist to clarify.  If you requested that your care partner not be given the details of your procedure findings, then the procedure report has been included in a sealed envelope for you to review at your convenience later.  YOU SHOULD EXPECT: Some feelings of bloating in the abdomen. Passage of more gas than usual.  Walking can help get rid of the air that was put into your GI tract during the procedure and reduce the bloating. If you had a lower endoscopy (such as a colonoscopy or flexible sigmoidoscopy) you may notice spotting of blood in your stool or on the toilet paper. If you underwent a bowel prep for your procedure, you may not have a normal bowel movement for a few days.  Please Note:  You might notice some irritation and congestion in your nose or some drainage.  This is from the oxygen used during your procedure.  There is no need for concern and it should clear up in a day or so.  SYMPTOMS TO REPORT IMMEDIATELY:  Following lower endoscopy (colonoscopy or flexible sigmoidoscopy):  Excessive amounts of blood in the stool  Significant tenderness or worsening of abdominal pains  Swelling of the abdomen that is new, acute  Fever of 100F or higher   For urgent or emergent issues, a gastroenterologist can be reached at any hour by calling [phone]. Do not use MyChart messaging for urgent concerns.    DIET:  We do recommend a small meal at first, but then you may proceed to your regular diet.  Drink plenty of fluids but you should avoid alcoholic beverages for 24 hours.  ACTIVITY:  You should plan to take it  easy for the rest of today and you should NOT DRIVE or use heavy machinery until tomorrow (because of the sedation medicines used during the test).    FOLLOW UP: Our staff will call the number listed on your records the next business day following your procedure.  We will call around 7:15- 8:00 am to check on you and address any questions or concerns that you may have regarding the information given to you following your procedure. If we do not reach you, we will leave a message.     If any biopsies were taken you will be contacted by phone or by letter within the next 1-3 weeks.  Please call us at [phone] if you have not heard about the biopsies in 3 weeks.    SIGNATURES/CONFIDENTIALITY: You and/or your care partner have signed paperwork which will be entered into your electronic medical record.  These signatures attest to the fact that that the information above on your After Visit Summary has been reviewed and is understood.  Full responsibility of the confidentiality of this discharge information lies with you and/or your care-partner. 

## 2022-02-28 NOTE — Progress Notes (Signed)
Report to pacu rn. Vss. Care resumed by rn. 

## 2022-02-28 NOTE — Op Note (Signed)
[facility] Patient Name: Matthew Ellison Procedure Date: 02/28/2022 9:46 AM MRN: [id number] Endoscopist: Sonny Masters "Matthew Ellison ,  Age: 46 Referring MD:  Date of Birth: [birth year] Gender: Male Account #: [id number] Procedure:                Colonoscopy Indications:              Screening for colorectal malignant neoplasm, This                            is the patient's first colonoscopy Medicines:                Monitored Anesthesia Care Procedure:                Pre-Anesthesia Assessment:                           - Prior to the procedure, a History and Physical                            was performed, and patient medications and                            allergies were reviewed. The patient's tolerance of                            previous anesthesia was also reviewed. The risks                            and benefits of the procedure and the sedation                            options and risks were discussed with the patient.                            All questions were answered, and informed consent                            was obtained. Prior Anticoagulants: The patient has                            taken no previous anticoagulant or antiplatelet                            agents. ASA Grade Assessment: II - A patient with                            mild systemic disease. After reviewing the risks                            and benefits, the patient was deemed in                            satisfactory condition to undergo the procedure.  After obtaining informed consent, the colonoscope                            was passed under direct vision. Throughout the                            procedure, the patient's blood pressure, pulse, and                            oxygen saturations were monitored continuously. The                            Colonoscope was introduced through the anus and                            advanced to the the  terminal ileum. The colonoscopy                            was performed without difficulty. The patient                            tolerated the procedure well. The quality of the                            bowel preparation was good. The terminal ileum,                            ileocecal valve, appendiceal orifice, and rectum                            were photographed. Scope In: 10:08:38 AM Scope Out: 10:30:11 AM Scope Withdrawal Time: 0 hours 18 minutes 40 seconds  Total Procedure Duration: 0 hours 21 minutes 33 seconds  Findings:                 The terminal ileum appeared normal.                           Three sessile polyps were found in the ascending                            colon and cecum. The polyps were 3 to 7 mm in size.                            These polyps were removed with a cold snare.                            Resection and retrieval were complete.                           A 10 mm polyp was found in the ascending colon. The                            polyp was pedunculated. The polyp  was removed with                            a hot snare. Resection and retrieval were complete.                           A 3 mm polyp was found in the sigmoid colon. The                            polyp was sessile. The polyp was removed with a                            cold snare. Resection and retrieval were complete.                           Non-bleeding internal hemorrhoids were found during                            retroflexion. Complications:            No immediate complications. Estimated Blood Loss:     Estimated blood loss was minimal. Impression:               - The examined portion of the ileum was normal.                           - Three 3 to 7 mm polyps in the ascending colon and                            in the cecum, removed with a cold snare. Resected                            and retrieved.                           - One 10 mm polyp in the ascending  colon, removed                            with a hot snare. Resected and retrieved.                           - One 3 mm polyp in the sigmoid colon, removed with                            a cold snare. Resected and retrieved.                           - Non-bleeding internal hemorrhoids. Recommendation:           - Discharge patient to home (with escort).                           - Await pathology results.                           -  The findings and recommendations were discussed                            with the patient. Dr Georgian Co "Matthew Leo" [name],  02/28/2022 10:34:17 AM

## 2022-02-28 NOTE — Progress Notes (Signed)
Pt's states no medical or surgical changes since previsit or office visit. 

## 2022-03-01 ENCOUNTER — Telehealth: Payer: Self-pay

## 2022-03-01 NOTE — Telephone Encounter (Signed)
  Follow up Call-     02/28/2022    9:39 AM  Call back number  Post procedure Call Back phone  # [phone]  Permission to leave phone message Yes     Patient questions:  Do you have a fever, pain , or abdominal swelling? No. Pain Score  0 *  Have you tolerated food without any problems? Yes.    Have you been able to return to your normal activities? Yes.    Do you have any questions about your discharge instructions: Diet   No. Medications  No. Follow up visit  No.  Do you have questions or concerns about your Care? No.  Actions: * If pain score is 4 or above: No action needed, pain <4.

## 2022-03-02 ENCOUNTER — Encounter: Payer: Self-pay | Admitting: Internal Medicine

## 2022-04-26 ENCOUNTER — Ambulatory Visit: Payer: Medicare Other | Admitting: Allergy and Immunology

## 2023-01-20 ENCOUNTER — Ambulatory Visit
Admission: EM | Admit: 2023-01-20 | Discharge: 2023-01-20 | Disposition: A | Discharge status: Home / Self Care | Payer: 59 | Attending: Physician Assistant | Admitting: Physician Assistant

## 2023-01-20 DIAGNOSIS — L230 Allergic contact dermatitis due to metals: Secondary | ICD-10-CM

## 2023-01-20 MED ORDER — TRIAMCINOLONE ACETONIDE 0.1 % EX CREA: 1.0000 | TOPICAL_CREAM | Freq: Two times a day (BID) | CUTANEOUS | 0 refills | Status: AC

## 2023-01-20 NOTE — ED Provider Notes (Signed)
[facility]    CSN: [id number] Arrival date & time: 01/20/23  1535      History   Chief Complaint Chief Complaint  Patient presents with   Skin Problem    HPI Matthew Ellison is a 47 y.o. male.   Patient here today for evaluation of rash to his right lower abdomen.  He states that rash is very itchy and red and flaky.  He has not any fever.  Rash has been there for quite some time.  He does not report any shortness of breath or trouble breathing.  The history is provided by the patient.    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 Authorizing Provider  albuterol (VENTOLIN HFA) 108 (90 Base) MCG/ACT inhaler INHALE 1-2 PUFFS BY MOUTH EVERY 6 HOURS AS NEEDED FOR WHEEZE OR SHORTNESS OF BREATH 03/01/21  Yes Kozlow, Alvira Philips, MD  cetirizine (ZYRTEC) 10 MG tablet TAKE 1 TABLET BY MOUTH EVERY DAY AS NEEDED FOR ALLERGY 06/07/21  Yes Kozlow, Alvira Philips, MD  fluticasone furoate-vilanterol (BREO ELLIPTA) 200-25 MCG/INH AEPB TAKE 1 PUFF BY MOUTH EVERY DAY 09/01/20  Yes Kozlow, Alvira Philips, MD  mometasone (NASONEX) 50 MCG/ACT nasal spray USE 2 SPRAYS INTO THE NOSE DAILY 10/09/19  Yes Sagardia, Eilleen Kempf, MD  montelukast (SINGULAIR) 10 MG tablet Take 1 tablet (10 mg total) by mouth at bedtime. 08/01/18  Yes Stallings, Zoe A, MD  triamcinolone cream (KENALOG) 0.1 % Apply 1 Application topically 2 (two) times daily. 01/20/23  Yes Tomi Bamberger, PA-C  hydrocortisone  (ANUSOL-HC) 25 MG suppository Place 25 mg rectally 2 (two) times daily. 01/31/22   [provider]  omeprazole (PRILOSEC) 40 MG capsule Take 1 capsule (40 mg total) by mouth daily. 08/01/18   Doristine Bosworth, MD  Vitamin D, Ergocalciferol, (DRISDOL) 1.25 MG (50000 UNIT) CAPS capsule Take 50,000 Units by mouth once a week. 02/01/22   [provider]    Family History Family History  Problem Relation Age of Onset   Allergic rhinitis Neg Hx    Angioedema Neg Hx    Asthma Neg Hx    Atopy Neg Hx    Eczema Neg Hx    Immunodeficiency Neg Hx    Urticaria Neg Hx    Colon polyps Neg Hx    Colon cancer Neg Hx    Esophageal cancer Neg Hx     Social History Social History   Tobacco Use   Smoking status: Never   Smokeless tobacco: Never  Vaping Use   Vaping status: Never Used  Substance Use Topics   Alcohol use: No   Drug use: No     Allergies   Shrimp (diagnostic) and Other   Review of Systems Review of Systems  Constitutional:  Negative for chills and fever.  Eyes:  Negative for discharge and redness.  Respiratory:  Negative for shortness of breath.   Skin:  Positive for rash. Negative for color change.  Neurological:  Negative for numbness.     Physical Exam Triage Vital Signs ED Triage Vitals  Encounter Vitals Group     BP 01/20/23 1547 137/87     Systolic BP Percentile --      Diastolic BP Percentile --      Pulse Rate 01/20/23 1547 78     Resp 01/20/23 1547 18     Temp 01/20/23 1547 97.6 F (36.4 C)     Temp Source 01/20/23 1547 Oral     SpO2 01/20/23 1547 98 %     Weight 01/20/23 1546 155 lb (70.3 kg)     Height 01/20/23 1546 5' 3.25" (1.607 m)     Head Circumference --      Peak Flow --      Pain Score 01/20/23 1546 0     Pain Loc --      Pain Education --      Exclude from Growth Chart --    No data found.  Updated Vital Signs BP 137/87 (BP Location: Left Arm)   Pulse 78   Temp 97.6 F (36.4 C) (Oral)   Resp 18   Ht 5' 3.25"  (1.607 m)   Wt 155 lb (70.3 kg)   SpO2 98%   BMI 27.24 kg/m      Physical Exam Vitals and nursing note reviewed.  Constitutional:      General: He is not in acute distress.    Appearance: Normal appearance. He is not ill-appearing.  HENT:     Head: Normocephalic and atraumatic.  Eyes:     Conjunctiva/sclera: Conjunctivae normal.  Cardiovascular:     Rate and Rhythm: Normal rate.  Pulmonary:     Effort: Pulmonary effort is normal. No respiratory distress.  Skin:    Comments: Mildly erythematous dry scaling rash to right lower abdomen at waistline.  On exam it appears that belt buckle makes contact with the skin.  Neurological:     Mental Status: He is alert.  Psychiatric:        Mood and Affect: Mood normal.        Behavior: Behavior normal.        Thought Content: Thought content normal.      UC Treatments / Results  Labs (all labs ordered are listed, but only abnormal results are displayed) Labs Reviewed - No data to display  EKG   Radiology No results found.  Procedures Procedures (including critical care time)  Medications Ordered in UC Medications - No data to display  Initial Impression / Assessment and Plan / UC Course  I have reviewed the triage vital signs and the nursing notes.  Pertinent labs & imaging results that were available during my care of the patient were reviewed by me and considered in my medical decision making (see chart for details).    Suspect likely contact dermatitis due to metal belt buckle given location.  Will treat with Kenalog cream and discussed avoiding use of current belt to prevent further rash/itching.   Final Clinical Impressions(s) / UC Diagnoses   Final diagnoses:  Contact dermatitis due to metals, unspecified contact dermatitis type   Discharge Instructions   None    ED Prescriptions     Medication Sig Dispense Auth. Provider   triamcinolone cream (KENALOG) 0.1 % Apply 1 Application topically 2 (two) times  daily. 30 g Tomi Bamberger, PA-C      PDMP not reviewed this encounter.  Tomi Bamberger, PA-C 01/20/23 1715

## 2023-01-20 NOTE — ED Triage Notes (Signed)
"  I have a big sore on my right lower abd area/pant line". "Very itchy, red, flaky at times". No fever.

## 2024-01-23 ENCOUNTER — Ambulatory Visit: Admitting: Allergy and Immunology
